# Patient Record
Sex: Female | Born: 1955
Health system: Southern US, Community
[De-identification: ages and names within clinical notes are randomized; demographics above are authoritative.]

## PROBLEM LIST (undated history)

## (undated) DIAGNOSIS — N200 Calculus of kidney: Secondary | ICD-10-CM

## (undated) DIAGNOSIS — I2510 Atherosclerotic heart disease of native coronary artery without angina pectoris: Secondary | ICD-10-CM

## (undated) DIAGNOSIS — I10 Essential (primary) hypertension: Secondary | ICD-10-CM

## (undated) HISTORY — PX: ABDOMINAL HYSTERECTOMY: SHX81

## (undated) HISTORY — DX: Calculus of kidney: N20.0

## (undated) HISTORY — PX: CORONARY ARTERY BYPASS GRAFT: SHX141

## (undated) HISTORY — PX: NEPHRECTOMY: SHX65

---

## 2010-03-07 ENCOUNTER — Encounter: Admission: RE | Admit: 2010-03-07 | Discharge: 2010-03-07 | Payer: Self-pay | Admitting: Infectious Diseases

## 2014-03-16 ENCOUNTER — Ambulatory Visit (INDEPENDENT_AMBULATORY_CARE_PROVIDER_SITE_OTHER): Payer: 59 | Admitting: Family Medicine

## 2014-03-16 VITALS — BP 122/80 | HR 90 | Temp 98.0°F | Resp 18 | Ht 59.0 in | Wt 111.0 lb

## 2014-03-16 DIAGNOSIS — J302 Other seasonal allergic rhinitis: Secondary | ICD-10-CM

## 2014-03-16 DIAGNOSIS — J329 Chronic sinusitis, unspecified: Secondary | ICD-10-CM

## 2014-03-16 DIAGNOSIS — J309 Allergic rhinitis, unspecified: Secondary | ICD-10-CM

## 2014-03-16 MED ORDER — FLUTICASONE PROPIONATE 50 MCG/ACT NA SUSP: 2.0000 | Freq: Every day | NASAL | Status: DC

## 2014-03-16 MED ORDER — AMOXICILLIN 500 MG PO CAPS: 500.0000 mg | ORAL_CAPSULE | Freq: Two times a day (BID) | ORAL | Status: DC

## 2014-03-16 NOTE — Patient Instructions (Signed)
Allergies Allergies may happen from anything your body is sensitive to. This may be food, medicines, pollens, chemicals, and nearly anything around you in everyday life that produces allergens. An allergen is anything that causes an allergy producing substance. Heredity is often a factor in causing these problems. This means you may have some of the same allergies as your parents. Food allergies happen in all age groups. Food allergies are some of the most severe and life threatening. Some common food allergies are cow's milk, seafood, eggs, nuts, wheat, and soybeans. SYMPTOMS   Swelling around the mouth.  An itchy red rash or hives.  Vomiting or diarrhea.  Difficulty breathing. SEVERE ALLERGIC REACTIONS ARE LIFE-THREATENING. This reaction is called anaphylaxis. It can cause the mouth and throat to swell and cause difficulty with breathing and swallowing. In severe reactions only a trace amount of food (for example, peanut oil in a salad) may cause death within seconds. Seasonal allergies occur in all age groups. These are seasonal because they usually occur during the same season every year. They may be a reaction to molds, grass pollens, or tree pollens. Other causes of problems are house dust mite allergens, pet dander, and mold spores. The symptoms often consist of nasal congestion, a runny itchy nose associated with sneezing, and tearing itchy eyes. There is often an associated itching of the mouth and ears. The problems happen when you come in contact with pollens and other allergens. Allergens are the particles in the air that the body reacts to with an allergic reaction. This causes you to release allergic antibodies. Through a chain of events, these eventually cause you to release histamine into the blood stream. Although it is meant to be protective to the body, it is this release that causes your discomfort. This is why you were given anti-histamines to feel better. If you are unable to  pinpoint the offending allergen, it may be determined by skin or blood testing. Allergies cannot be cured but can be controlled with medicine. Hay fever is a collection of all or some of the seasonal allergy problems. It may often be treated with simple over-the-counter medicine such as diphenhydramine. Take medicine as directed. Do not drink alcohol or drive while taking this medicine. Check with your caregiver or package insert for child dosages. If these medicines are not effective, there are many new medicines your caregiver can prescribe. Stronger medicine such as nasal spray, eye drops, and corticosteroids may be used if the first things you try do not work well. Other treatments such as immunotherapy or desensitizing injections can be used if all else fails. Follow up with your caregiver if problems continue. These seasonal allergies are usually not life threatening. They are generally more of a nuisance that can often be handled using medicine. HOME CARE INSTRUCTIONS   If unsure what causes a reaction, keep a diary of foods eaten and symptoms that follow. Avoid foods that cause reactions.  If hives or rash are present:  Take medicine as directed.  You may use an over-the-counter antihistamine (diphenhydramine) for hives and itching as needed.  Apply cold compresses (cloths) to the skin or take baths in cool water. Avoid hot baths or showers. Heat will make a rash and itching worse.  If you are severely allergic:  Following a treatment for a severe reaction, hospitalization is often required for closer follow-up.  Wear a medic-alert bracelet or necklace stating the allergy.  You and your family must learn how to give adrenaline or use   an anaphylaxis kit.  If you have had a severe reaction, always carry your anaphylaxis kit or EpiPen with you. Use this medicine as directed by your caregiver if a severe reaction is occurring. Failure to do so could have a fatal outcome. SEEK MEDICAL  CARE IF:  You suspect a food allergy. Symptoms generally happen within 30 minutes of eating a food.  Your symptoms have not gone away within 2 days or are getting worse.  You develop new symptoms.  You want to retest yourself or your child with a food or drink you think causes an allergic reaction. Never do this if an anaphylactic reaction to that food or drink has happened before. Only do this under the care of a caregiver. SEEK IMMEDIATE MEDICAL CARE IF:   You have difficulty breathing, are wheezing, or have a tight feeling in your chest or throat.  You have a swollen mouth, or you have hives, swelling, or itching all over your body.  You have had a severe reaction that has responded to your anaphylaxis kit or an EpiPen. These reactions may return when the medicine has worn off. These reactions should be considered life threatening. MAKE SURE YOU:   Understand these instructions.  Will watch your condition.  Will get help right away if you are not doing well or get worse. Document Released: 01/30/2003 Document Revised: 03/03/2013 Document Reviewed: 07/06/2008 [facility] Patient Information 2014 [place], [place]. Vim xoang (Sinusitis) Vim xoang l hi?n t??ng t?y ??, ?au nh?c v s?ng (vim) ? cc xoang c?nh m?i. Xoang c?nh m?i l cc ti kh trong x??ng m?t (bn d??i m?t, gi?a trn ho?c trn m?t). Trong cc xoang c?nh m?i kh?e m?nh, d?ch nh?y c th? thot ra ngoi v khng kh c th? l?u thng qua chng theo ???ng m?i. Tuy nhin, khi cc xoang c?nh m?i b? vim, d?ch nh?y v khng kh c th? b? m?c k?t. ?i?u ny c th? lm cho vi khu?n v vi trng khc pht tri?n v gy nhi?m trng. Vim xoang c th? pht tri?n m?t cch nhanh chng v ko di trong m?t th?i gian ng?n (c?p tnh) ho?c ti?p t?c trong th?i gian di (m?n tnh). Vim xoang ko di h?n 12 tu?n ???c coi l m?n tnh.  NGUYN NHN Nguyn nhn vim xoang bao g?m:  D? ?ng.  D? d?ng c?u trc, ch?ng h?n nh? d?ch chuy?n v? tr c?a  s?n phn cch l? m?i (l?ch vch ng?n), c th? lm gi?m lu?ng khng kh qua m?i c?ng nh? cc xoang v ?nh h??ng ??n kh? n?ng d?n l?u c?a xoang.  B?t th??ng v? ch?c n?ng, ch?ng h?n nh? khi cc s?i lng nh? (lng mao) ph? cc xoang v gip lo?i b? d?ch nh?y khng ho?t ??ng ?ng ho?c khng c. TRI?U CH?NG Cc tri?u ch?ng c?a vim xoang c?p tnh v m?n tnh ??u gi?ng nhau. Cc tri?u ch?ng chnh l ?au v p l?c xung quanh xoang b? ?nh h??ng. Cc tri?u ch?ng khc bao g?m:  ?au r?ng trn.  ?au tai.  ?au ??u.  H?i th? hi.  Suy gi?m thnh gic v v? gic.  Ho n?ng h?n khi n?m.  M?t m?i.  S?t.  R? d?ch ??c t? m?i, th??ng c mu xanh v c th? ch?a m?.  S?ng v ?m h?n ? cc xoang b? ?nh h??ng. CH?N ?ON Chuyn gia ch?m [place] s?c kh?e s? khm th?c th?Suzzette Righter qu trnh Loren Racer, chuyn gia ch?m [place] s?c kh?e c th?:  Soi m?i c?a b?n  xem c cc d?u hi?u c?a c?c u b?t th??ng trong l? m?i (polyp m?i) khng.  G vo cc xoang b? ?nh h??ng ?? ki?m tra d?u hi?u nhi?m trng.  Xem bn trong cc xoang (n?i soi) b?ng m?t thi?t b? hnh ?nh ??c bi?t c g?n ?n (n?i soi) ???c ??a vo xoang. N?u chuyn gia ch?m [place] s?c kh?e nghi ng? r?ng b?n b? vim xoang m?n tnh, m?t ho?c nhi?u xt nghi?m sau ?y c th? ???c khuy?n ngh?:  Xt nghi?m d? ?ng.  L?y m?u c?y ? m?i-M?t m?u d?ch nh?y ???c l?y t? m?i c?a b?n v g?i ??n phng th nghi?m ?? ki?m tra vi khu?n.  T? bo h?c ? m?i-M?t m?u d?ch nh?y ???c l?y t? m?i c?a b?n v xt nghi?m b?i chuyn gia ch?m [place] s?c kh?e ?? xc ??nh xem tnh tr?ng vim xoang c?a b?n c lin quan ??n d? ?ng hay khng. ?I?U TR? H?u h?t cc tr??ng h?p vim xoang c?p tnh c lin quan ??n nhi?m vi rt v s? t? kh?i trong vng 10 ngy. ?i khi thu?c ???c k ??n ?? gip lm gi?m cc tri?u ch?ng (thu?c gi?m ?au, thu?c ch?ng sung huy?t m?i, thu?c x?t m?i steroid ho?c bnh x?t n??c mu?i). Tuy nhin, v?i vim xoang lin quan ??n nhi?m vi khu?n, chuyn gia ch?m [place] s?c kh?e s? k thu?c khng sinh.  ?y l nh?ng lo?i thu?c s? gip tiu di?t vi khu?n gy nhi?m trng. Trong tr??ng h?p hi?m g?p, vim xoang do nhi?m n?m. Trong nh?ng tr??ng h?p ny, chuyn gia ch?m [place] s?c kh?e s? k thu?c khng n?m. M?t s? tr??ng h?p vim xoang m?n tnh s? c?n ph?u thu?t. Ni chung, ?y l nh?ng tr??ng h?p vim xoang ti pht trn 3 l?n m?i n?m, m?c d ? th?c hi?n cc ph??ng php ?i?u tr? khc. H??NG D?N CH?M [place] T?I NH  U?ng th?t nhi?u n??c. N??c gip lm long d?ch nh?y ?? xoang c th? d?n l?u ra d? dng h?n.  S? d?ng my t?o ?m.  Ht h?i n??c 3 ??n 4 l?n m?t ngy (v d?, ng?i trong phng t?m v?i vi sen ?ang ch?y).  ??t kh?n ?m, ?m ln m?t 3 ??n 4 l?n m?t ngy, ho?c theo ch? d?n c?a chuyn gia ch?m [place] s?c kh?e.  S? d?ng bnh x?t m?i ch?a n??c mu?i ?? gip lm ?m v lm s?ch xoang.  Ch? s? d?ng thu?c khng c?n k toa ho?c thu?c c?n k toa ?? gi?m ?au, gi?m c?m gic kh ch?u ho?c h? s?t theo ch? d?n c?a chuyn gia ch?m [place] s?c kh?e c?a b?n. HY NGAY L?P T?C ?I KHM N?U:  B?n b? ?au gia t?ng ho?c ?au ??u n?ng.  B?n b? bu?n nn, nn m?a ho?c bu?n ng?.  B?n b? s?ng xung quanh m?t.  B?n c v?n ?? v? th? l?c.  B?n b? c?ng c?.  B?n b? kh th?. ??M B?O B?N:  Hi?u cc h??ng d?n ny.  S? theo di tnh tr?ng c?a mnh.  S? yu c?u tr? gip ngay l?p t?c n?u b?n c?m th?y khng ?? ho?c tnh tr?ng tr?m tr?ng h?n. Document Released: 05/07/2012 Document Revised: 07/09/2013 [facility] Patient Information 2014 [place], [place].

## 2014-03-16 NOTE — Progress Notes (Signed)
 Chief Complaint:  Chief Complaint  Patient presents with  . Allergies  . Sore Throat  . Headache  . Fatigue    HPI: Ana Glenn is a 58 y.o. female who is here for:  Sinus issues and sore throat x 1 week. + HA, + sinus congestion. She has allergies, has been taking claritin . Denies cough. Nonsmoker, denies fevers, chills. Has sinus HA and congestion. Denies ear pain. She has not had any CP or SOB.    She has her heart procedure done 1971, for some growth on her heart She had hysterctomy benign fibroids  in 1987 Has been in [place] for 5 [place] Has a history of renal stones, last checked in [place] was  9 mm stone on left side.  She is currently asymptomatic for her kidney stone   Past Medical History  Diagnosis Date  . Kidney stones    Past Surgical History  Procedure Laterality Date  . Coronary artery bypass graft    . Abdominal hysterectomy     History   Social History  . Marital Status: Married    Spouse Name: N/A    Number of Children: N/A  . Years of Education: N/A   Social History Main Topics  . Smoking status: Never Smoker   . Smokeless tobacco: None  . Alcohol Use: No  . Drug Use: No  . Sexual Activity: None   Other Topics Concern  . None   Social History Narrative  . None   Family History  Problem Relation Age of Onset  . Diabetes Mother   . Cancer Brother    No Known Allergies Prior to Admission medications   Not on File     ROS: The patient denies fevers, chills, night sweats, unintentional weight loss, chest pain, palpitations, wheezing, dyspnea on exertion, nausea, vomiting, abdominal pain, dysuria, hematuria, melena, numbness, weakness, or tingling.   All other systems have been reviewed and were otherwise negative with the exception of those mentioned in the HPI and as above.    PHYSICAL EXAM: Filed Vitals:   03/16/14 1408  BP: 122/80  Pulse: 90  Temp: 98 F (36.7 C)  Resp: 18   Filed Vitals:   03/16/14 1408  Height: 4'  11" (1.499 m)  Weight: 111 lb (50.349 kg)   Body mass index is 22.41 kg/(m^2).  General: Alert, no acute distress HEENT:  Normocephalic, atraumatic, oropharynx patent. EOMI, PERRLA, TM nl, + sinus tenderness, no erythema or exudates of OP Cardiovascular:  Regular rate and rhythm, no rubs murmurs or gallops.  No Carotid bruits, radial pulse intact. No pedal edema.  Respiratory: Clear to auscultation bilaterally.  No wheezes, rales, or rhonchi.  No cyanosis, no use of accessory musculature GI: No organomegaly, abdomen is soft and non-tender, positive bowel sounds.  No masses. Skin: No rashes. Neurologic: Facial musculature symmetric. Psychiatric: Patient is appropriate throughout our interaction. Lymphatic: No cervical lymphadenopathy Musculoskeletal: Gait intact.   LABS: No results found for this or any previous visit.   EKG/XRAY:   Primary [facility]   ASSESSMENT/PLAN: Encounter Diagnoses  Name Primary?  . Seasonal allergies Yes  . Sinusitis    Cw Claritin Rx Flonase Rx Amox x 10 days if sxs treatment does not help OTc cough meds F/u prn  Gross sideeffects, risk and benefits, and alternatives of medications d/w patient. Patient is aware that all medications have potential sideeffects and we are unable to predict every sideeffect  or drug-drug interaction that may occur.   P , DO 03/17/2014 9:22 AM

## 2014-12-07 ENCOUNTER — Ambulatory Visit (INDEPENDENT_AMBULATORY_CARE_PROVIDER_SITE_OTHER): Payer: 59

## 2014-12-07 ENCOUNTER — Ambulatory Visit (INDEPENDENT_AMBULATORY_CARE_PROVIDER_SITE_OTHER): Payer: 59 | Admitting: Family Medicine

## 2014-12-07 VITALS — BP 148/90 | HR 58 | Temp 97.7°F | Resp 16 | Ht 60.0 in | Wt 106.6 lb

## 2014-12-07 DIAGNOSIS — Z951 Presence of aortocoronary bypass graft: Secondary | ICD-10-CM

## 2014-12-07 DIAGNOSIS — R42 Dizziness and giddiness: Secondary | ICD-10-CM

## 2014-12-07 DIAGNOSIS — Z87442 Personal history of urinary calculi: Secondary | ICD-10-CM

## 2014-12-07 DIAGNOSIS — A499 Bacterial infection, unspecified: Secondary | ICD-10-CM

## 2014-12-07 DIAGNOSIS — I951 Orthostatic hypotension: Secondary | ICD-10-CM

## 2014-12-07 DIAGNOSIS — N390 Urinary tract infection, site not specified: Secondary | ICD-10-CM

## 2014-12-07 DIAGNOSIS — R002 Palpitations: Secondary | ICD-10-CM

## 2014-12-07 LAB — POCT CBC
Granulocyte percent: 83.1 %G — AB (ref 37–80)
HCT, POC: 44.2 % (ref 37.7–47.9)
Hemoglobin: 13.8 g/dL (ref 12.2–16.2)
Lymph, poc: 1.3 (ref 0.6–3.4)
MCH, POC: 26.2 pg — AB (ref 27–31.2)
MCHC: 31.4 g/dL — AB (ref 31.8–35.4)
MCV: 83.7 fL (ref 80–97)
MID (cbc): 0.4 (ref 0–0.9)
MPV: 7.4 fL (ref 0–99.8)
POC Granulocyte: 8.3 — AB (ref 2–6.9)
POC LYMPH PERCENT: 13.3 %L (ref 10–50)
POC MID %: 3.6 % (ref 0–12)
Platelet Count, POC: 234 10*3/uL (ref 142–424)
RBC: 5.28 M/uL (ref 4.04–5.48)
RDW, POC: 15.8 %
WBC: 10 10*3/uL (ref 4.6–10.2)

## 2014-12-07 LAB — POCT UA - MICROSCOPIC ONLY
Bacteria, U Microscopic: NEGATIVE
Casts, Ur, LPF, POC: NEGATIVE
Crystals, Ur, HPF, POC: NEGATIVE
Epithelial cells, urine per micros: NEGATIVE
Mucus, UA: NEGATIVE
Yeast, UA: NEGATIVE

## 2014-12-07 LAB — COMPLETE METABOLIC PANEL WITHOUT GFR
ALT: 15 U/L (ref 0–35)
AST: 18 U/L (ref 0–37)
BUN: 10 mg/dL (ref 6–23)
Calcium: 9.4 mg/dL (ref 8.4–10.5)
Chloride: 102 meq/L (ref 96–112)
GFR, Est Non African American: >89 mL/min
Potassium: 4.1 meq/L (ref 3.5–5.3)
Sodium: 141 meq/L (ref 135–145)
Total Bilirubin: 0.5 mg/dL (ref 0.2–1.2)

## 2014-12-07 LAB — POCT URINALYSIS DIPSTICK
Bilirubin, UA: NEGATIVE
Glucose, UA: NEGATIVE
Ketones, UA: NEGATIVE
Nitrite, UA: NEGATIVE
Protein, UA: NEGATIVE
Spec Grav, UA: 1.01
Urobilinogen, UA: 0.2
pH, UA: 6

## 2014-12-07 LAB — LIPID PANEL
Cholesterol: 240 mg/dL — ABNORMAL HIGH (ref 0–200)
HDL: 76 mg/dL (ref >39)
LDL Cholesterol: 147 mg/dL — ABNORMAL HIGH (ref 0–99)
Total CHOL/HDL Ratio: 3.2 ratio
Triglycerides: 87 mg/dL (ref <150)
VLDL: 17 mg/dL (ref 0–40)

## 2014-12-07 LAB — COMPLETE METABOLIC PANEL WITH GFR
Albumin: 4.3 g/dL (ref 3.5–5.2)
Alkaline Phosphatase: 61 U/L (ref 39–117)
CO2: 27 mEq/L (ref 19–32)
Creat: 0.56 mg/dL (ref 0.50–1.10)
GFR, Est African American: >89 mL/min
Glucose, Bld: 83 mg/dL (ref 70–99)
Total Protein: 7.9 g/dL (ref 6.0–8.3)

## 2014-12-07 LAB — TSH: TSH: 0.695 u[IU]/mL (ref 0.350–4.500)

## 2014-12-07 MED ORDER — CEPHALEXIN 500 MG PO CAPS
500.0000 mg | ORAL_CAPSULE | Freq: Three times a day (TID) | ORAL | Status: DC
Start: 2014-12-07 — End: 2015-02-02

## 2014-12-07 NOTE — Progress Notes (Signed)
Chief Complaint:  Chief Complaint  Patient presents with  . Fatigue  . Dizziness    HPI: Ana Glenn is a 59 y.o. female who is here for chills since this morning, she has taken tylenol, sha has had subjective fevers.She has sore throat. She has no rashes. She has no diarrhea. She has had a poor appeitite.  She has back pain but has no abd pain. She has no probems urianting,  She has fatigue . She started drinking more water this morning, normally does not have a lot of fluids. She had a charley horse in her legft leg this AM.  Her husband was seen in ER for PNA. She has not tried anythign for this. She has a personal hx of renal stones, last one wa son left side and was about 9 mm per patient when seh was in [place], she has been here for 6 years already.   She has intermittent palpitations, at rest and with exertion. Deneis CP or SOB or wheezing at this time. NO pedal edema. She has not seen a cardiologist since she has been here. She has had CABG and from what I can tell this sounds like a ? Pericardiectomy , but I am not sure, this was was done in 1971, and she states that she ahd sever CP and SOB and they removed a flim ? Pericardium around her chest and she felt better. She states she never had a stent or anything done to her vessel. She is not followed by cardiology.   She has has uterus removed for fibroids.  Past Medical History  Diagnosis Date  . Kidney stones    Past Surgical History  Procedure Laterality Date  . Coronary artery bypass graft    . Abdominal hysterectomy     History   Social History  . Marital Status: Married    Spouse Name: N/A    Number of Children: N/A  . Years of Education: N/A   Social History Main Topics  . Smoking status: Never Smoker   . Smokeless tobacco: None  . Alcohol Use: No  . Drug Use: No  . Sexual Activity: None   Other Topics Concern  . None   Social History Narrative   Family History  Problem Relation Age of Onset  .  Diabetes Mother   . Cancer Brother    No Known Allergies Prior to Admission medications   Medication Sig Start Date End Date Taking? Authorizing Provider  amoxicillin (AMOXIL) 500 MG capsule Take 1 capsule (500 mg total) by mouth 2 (two) times daily. Patient not taking: Reported on 12/07/2014 03/16/14   Yannis Gumbs P Shone Leventhal, DO  fluticasone (FLONASE) 50 MCG/ACT nasal spray Place 2 sprays into both nostrils daily. Patient not taking: Reported on 12/07/2014 03/16/14   Kurk Corniel P Leahann Lempke, DO     ROS: The patient denies fevers, chills, night sweats, unintentional weight loss, chest pain,  wheezing, dyspnea on exertion, nausea, vomiting, abdominal pain, dysuria, hematuria, melena, numbness,  or tingling.   All other systems have been reviewed and were otherwise negative with the exception of those mentioned in the HPI and as above.    PHYSICAL EXAM: Filed Vitals:   12/07/14 1432  BP: 148/90  Pulse: 58  Temp:   Resp:    Filed Vitals:   12/07/14 1238  Height: 5' (1.524 m)  Weight: 106 lb 9.6 oz (48.353 kg)   Body mass index is 20.82 kg/(m^2).  General: Alert, no acute  distress HEENT:  Normocephalic, atraumatic, oropharynx patent. EOMI, PERRLA. + Left TM slight erythematous na dull but she states she has that normally Cardiovascular:  Regular rate and rhythm, no rubs murmurs or gallops.  No Carotid bruits, radial pulse intact. No pedal edema.  + CABG scar Respiratory: Clear to auscultation bilaterally.  No wheezes, rales, or rhonchi.  No cyanosis, no use of accessory musculature GI: No organomegaly, abdomen is soft and non-tender, positive bowel sounds.  No masses. Skin: No rashes. Neurologic: Facial musculature symmetric. CN 2-12 grossly normal Psychiatric: Patient is appropriate throughout our interaction. Lymphatic: No cervical lymphadenopathy Musculoskeletal: Gait intact. 5/5 strength + right and left CVA tenderness   LABS: Results for orders placed or performed in visit on 12/07/14  Urine culture   Result Value Ref Range   Colony Count NO GROWTH    Organism ID, Bacteria NO GROWTH   TSH  Result Value Ref Range   TSH 0.695 0.350 - 4.500 uIU/mL  COMPLETE METABOLIC PANEL WITH GFR  Result Value Ref Range   Sodium 141 135 - 145 mEq/L   Potassium 4.1 3.5 - 5.3 mEq/L   Chloride 102 96 - 112 mEq/L   CO2 27 19 - 32 mEq/L   Glucose, Bld 83 70 - 99 mg/dL   BUN 10 6 - 23 mg/dL   Creat 5.78 4.69 - 6.29 mg/dL   Total Bilirubin 0.5 0.2 - 1.2 mg/dL   Alkaline Phosphatase 61 39 - 117 U/L   AST 18 0 - 37 U/L   ALT 15 0 - 35 U/L   Total Protein 7.9 6.0 - 8.3 g/dL   Albumin 4.3 3.5 - 5.2 g/dL   Calcium 9.4 8.4 - 52.8 mg/dL   GFR, Est African American >89 mL/min   GFR, Est Non African American >89 mL/min  Lipid panel  Result Value Ref Range   Cholesterol 240 (H) 0 - 200 mg/dL   Triglycerides 87 <413 mg/dL   HDL 76 >24 mg/dL   Total CHOL/HDL Ratio 3.2 Ratio   VLDL 17 0 - 40 mg/dL   LDL Cholesterol 401 (H) 0 - 99 mg/dL  POCT urinalysis dipstick  Result Value Ref Range   Color, UA yellow    Clarity, UA clear    Glucose, UA neg    Bilirubin, UA neg    Ketones, UA neg    Spec Grav, UA 1.010    Blood, UA moderate    pH, UA 6.0    Protein, UA neg    Urobilinogen, UA 0.2    Nitrite, UA neg    Leukocytes, UA Trace   POCT UA - Microscopic Only  Result Value Ref Range   WBC, Ur, HPF, POC 1-4    RBC, urine, microscopic 0-2    Bacteria, U Microscopic neg    Mucus, UA neg    Epithelial cells, urine per micros neg    Crystals, Ur, HPF, POC neg    Casts, Ur, LPF, POC neg    Yeast, UA neg   POCT CBC  Result Value Ref Range   WBC 10.0 4.6 - 10.2 K/uL   Lymph, poc 1.3 0.6 - 3.4   POC LYMPH PERCENT 13.3 10 - 50 %L   MID (cbc) 0.4 0 - 0.9   POC MID % 3.6 0 - 12 %M   POC Granulocyte 8.3 (A) 2 - 6.9   Granulocyte percent 83.1 (A) 37 - 80 %G   RBC 5.28 4.04 - 5.48 M/uL   Hemoglobin 13.8 12.2 -  16.2 g/dL   HCT, POC 16.1 09.6 - 47.9 %   MCV 83.7 80 - 97 fL   MCH, POC 26.2 (A) 27 - 31.2  pg   MCHC 31.4 (A) 31.8 - 35.4 g/dL   RDW, POC 04.5 %   Platelet Count, POC 234 142 - 424 K/uL   MPV 7.4 0 - 99.8 fL     EKG/XRAY:   Primary read interpreted by Dr. Conley [facility]s at [facility]. Looks similar to 2011, no acute cardiopulmoary process, aortic notch is prominent   ASSESSMENT/PLAN: Encounter Diagnoses  Name Primary?  . Dizziness and giddiness   . Heart palpitations   . UTI (urinary tract infection), bacterial Yes  . Hx of CABG   . Orthostatic hypotension   . History of kidney stones    60 y/o [place] female here with sxs of fatigue, dizziness and heart palpitations, she ah sbeen in the hospital with her husband who was dx owth PNA nd ahs not been eating or drinking much.  She is orthostatic, advise to Get up and down slowly, orthostatic due to ? dehydration Labs pending Keflex TID x 7 days for possible UTI, she has hematuria and also some trace leuks and a shift in her  Granulocyte count on CBC without leukocytosis,  urine cx pending Push fluids Refer to cardiology for hx of CABG and orthostatics  Await for official xray , F/u in 3 days if no improvement or prn   Gross sideeffects, risk and benefits, and alternatives of medications d/w patient. Patient is aware that all medications have potential sideeffects and we are unable to predict every sideeffect or drug-drug interaction that may occur.  Deondra Wigger PHUONG, DO 12/09/2014 1:58 PM

## 2014-12-07 NOTE — Patient Instructions (Signed)

## 2014-12-08 LAB — URINE CULTURE
Colony Count: NO GROWTH
Organism ID, Bacteria: NO GROWTH

## 2014-12-09 ENCOUNTER — Telehealth: Payer: Self-pay | Admitting: Family Medicine

## 2014-12-09 ENCOUNTER — Telehealth: Payer: Self-pay

## 2014-12-09 ENCOUNTER — Telehealth: Payer: Self-pay | Admitting: *Deleted

## 2014-12-09 ENCOUNTER — Telehealth: Payer: Self-pay | Admitting: Radiology

## 2014-12-09 DIAGNOSIS — Z87442 Personal history of urinary calculi: Secondary | ICD-10-CM | POA: Insufficient documentation

## 2014-12-09 DIAGNOSIS — N200 Calculus of kidney: Secondary | ICD-10-CM

## 2014-12-09 MED ORDER — TAMSULOSIN HCL 0.4 MG PO CAPS: 0.4000 mg | ORAL_CAPSULE | Freq: Every evening | ORAL | Status: DC | PRN

## 2014-12-09 NOTE — Telephone Encounter (Signed)
Pt calling about lab work.

## 2014-12-09 NOTE — Telephone Encounter (Signed)
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

## 2014-12-09 NOTE — Telephone Encounter (Signed)
Gave results on notes but I told them that you would give them a call also.    Phone [phone] you can speak with [place]

## 2014-12-09 NOTE — Telephone Encounter (Signed)
Advise to call me back, wanted to d/w patient labs. PLease ask them tog ive me a good number to reach them, and also th ename of the persont ot alk to who can speak english well  TSH, CMP normal Cholesterol slightly high , sicne she ahd som strange cardiac surgery I would recommend statin but not sure if she will take it.  Urine cx was normal and did not grow out anything. She can continue with the keflex for a total of 3 days and then stop.  Her chest xray showed a right lung nodule that the radiologist saw in 2011 and felt it was a pruior infection, the size of the right upper lung nodule has been unchanged. She needs to keep an eye on this. Also I would like to know if she has had a TB test in the recent past.  The radiologist thinks he saw a bunch of kidney stones on the left side. I would like her to take flomax and also drink a lot of fluids. Will refer her to urology since the stones are large in size.

## 2015-01-07 ENCOUNTER — Institutional Professional Consult (permissible substitution): Payer: Self-pay | Admitting: Cardiovascular Disease

## 2015-02-02 ENCOUNTER — Encounter: Payer: Self-pay | Admitting: Cardiovascular Disease

## 2015-02-02 ENCOUNTER — Ambulatory Visit (INDEPENDENT_AMBULATORY_CARE_PROVIDER_SITE_OTHER): Payer: 59 | Admitting: Cardiovascular Disease

## 2015-02-02 VITALS — BP 142/90 | HR 90 | Ht 60.0 in | Wt 108.8 lb

## 2015-02-02 DIAGNOSIS — Z8679 Personal history of other diseases of the circulatory system: Secondary | ICD-10-CM | POA: Insufficient documentation

## 2015-02-02 DIAGNOSIS — Z9889 Other specified postprocedural states: Secondary | ICD-10-CM

## 2015-02-02 DIAGNOSIS — E785 Hyperlipidemia, unspecified: Secondary | ICD-10-CM

## 2015-02-02 DIAGNOSIS — Z951 Presence of aortocoronary bypass graft: Secondary | ICD-10-CM

## 2015-02-02 MED ORDER — ATORVASTATIN CALCIUM 40 MG PO TABS: 40.0000 mg | ORAL_TABLET | Freq: Every day | ORAL | Status: DC

## 2015-02-02 NOTE — Patient Instructions (Signed)
Your physician has recommended you make the following change in your medication:  START Atorvastatin 40 mg once daily  Your physician recommends that you return for lab work in: 3 months for FASTING lab work - cholesterol, basic metabolic panel, liver panel  Your physician has requested that you have an echocardiogram. Echocardiography is a painless test that uses sound waves to create images of your heart. It provides your doctor with information about the size and shape of your heart and how well your heart's chambers and valves are working. This procedure takes approximately one hour. There are no restrictions for this procedure.  Your physician wants you to follow-up in: 6 months with Dr. Elease HashimotoNahser.  You will receive a reminder letter in the mail two months in advance. If you don't receive a letter, please call our office to schedule the follow-up appointment.

## 2015-02-02 NOTE — Progress Notes (Signed)
Cardiology Office Note   Date:  02/02/2015   ID:  Ana Glenn, DOB 20-Oct-1956, MRN [id number]  PCP:  Pcp Not In System  Cardiologist:   Ronnesha Mester, Deloris Ping, MD   No chief complaint on file.  Problem List  1. S/p pericardectomy - in [place] , in her 30s  2.    February 03, 2015: Ana Glenn is a 59 y.o. female who presents for follow up of occasional episodes of orthostasis. She is seen with her son in law  Darrold Span) who acts as Hydrologist.   She has rare episodes of very brief CP .  Has rare episodes of palpitations   Non smoker Walks occasionally , strolls her grandchild. Denies any chest pain or dyspnea with walking .      Past Medical History  Diagnosis Date  . Kidney stones     Past Surgical History  Procedure Laterality Date  . Coronary artery bypass graft    . Abdominal hysterectomy       Current Outpatient Prescriptions  Medication Sig Dispense Refill  . fluticasone (FLONASE) 50 MCG/ACT nasal spray Place 2 sprays into both nostrils daily. 16 g 5  . Potassium Citrate 15 MEQ (1620 MG) TBCR Take 15 mEq by mouth daily.    . tamsulosin (FLOMAX) 0.4 MG CAPS capsule Take 1 capsule (0.4 mg total) by mouth at bedtime as needed. becareful of dizziness, must drink water. 15 capsule 0   No current facility-administered medications for this visit.    Allergies:   Review of patient's allergies indicates no known allergies.    Social History:  The patient  reports that she has never smoked. She does not have any smokeless tobacco history on file. She reports that she does not drink alcohol or use illicit drugs.   Family History:  The patient's family history includes Cancer in her brother; Diabetes in her mother.    ROS:  Please see the history of present illness.    Review of Systems: Constitutional:  denies fever, chills, diaphoresis, appetite change and fatigue.  HEENT: denies photophobia, eye pain, redness, hearing loss, ear pain, congestion, sore throat,  rhinorrhea, sneezing, neck pain, neck stiffness and tinnitus.  Respiratory: denies SOB, DOE, cough, chest tightness, and wheezing.  Cardiovascular: denies chest pain, palpitations and leg swelling.  Gastrointestinal: denies nausea, vomiting, abdominal pain, diarrhea, constipation, blood in stool.  Genitourinary: denies dysuria, urgency, frequency, hematuria, flank pain and difficulty urinating.  Musculoskeletal: denies  myalgias, back pain, joint swelling, arthralgias and gait problem.   Skin: denies pallor, rash and wound.  Neurological: denies dizziness, seizures, syncope, weakness, light-headedness, numbness and headaches.   Hematological: denies adenopathy, easy bruising, personal or family bleeding history.  Psychiatric/ Behavioral: denies suicidal ideation, mood changes, confusion, nervousness, sleep disturbance and agitation.       All other systems are reviewed and negative.    PHYSICAL EXAM: VS:  BP 142/90 mmHg  Pulse 90  Ht 5' (1.524 m)  Wt 108 lb 12.8 oz (49.351 kg)  BMI 21.25 kg/m2 , BMI Body mass index is 21.25 kg/(m^2). GEN: Well nourished, well developed, in no acute distress HEENT: normal Neck: no JVD, carotid bruits, or masses Cardiac: RRR; no murmurs, rubs, or gallops,no edema ,  Sternotomy scar Respiratory:  clear to auscultation bilaterally, normal work of breathing GI: soft, nontender, nondistended, + BS MS: no deformity or atrophy Skin: warm and dry, no rash Neuro:  Strength and sensation are intact Psych: normal   EKG:  EKG is ordered today. The ekg ordered today demonstrates NSR at 90.  No ST or T wave changes   Recent Labs: 12/07/2014: ALT 15; BUN 10; Creatinine 0.56; Hemoglobin 13.8; Potassium 4.1; Sodium 141; TSH 0.695    Lipid Panel    Component Value Date/Time   CHOL 240* 12/07/2014 1545   TRIG 87 12/07/2014 1545   HDL 76 12/07/2014 1545   CHOLHDL 3.2 12/07/2014 1545   VLDL 17 12/07/2014 1545   LDLCALC 147* 12/07/2014 1545      Wt  Readings from Last 3 Encounters:  02/02/15 108 lb 12.8 oz (49.351 kg)  12/07/14 106 lb 9.6 oz (48.353 kg)  03/16/14 111 lb (50.349 kg)      Other studies Reviewed: Additional studies/ records that were reviewed today include: . Review of the above records demonstrates:    ASSESSMENT AND PLAN:  1.  History of pericarditis: The patient has a history of pericardiectomy. She is status post pericardotomy in the 711970s while in [place]. We will check an echocardiogram for follow-up of her history of pericarditis and pericardectomy.  She's not having any signs or symptoms of congestive heart failure and no signs or symptoms of constrictive pericarditis. I'll see her in 6 month. I will anticipate seeing her yearly follow-up.  2. Hyperlipidemia: Her lipids were checked by her primary doctor. Her LDL is in the 140s. We'll start her on atorvastatin 40 mg a day. We'll check fasting labs in 3 months.   Current medicines are reviewed at length with the patient today.  The patient does not have concerns regarding medicines.  The following changes have been made:  no change  Labs/ tests ordered today include:  No orders of the defined types were placed in this encounter.     Disposition:   FU with me in 6 months.     Signed, Taysean Wager, Deloris PingPhilip J, MD  02/02/2015 10:02 AM    [facility] [address], [place], [place]  [address] Phone: [phone]; Fax: [phone]

## 2015-02-11 ENCOUNTER — Ambulatory Visit (HOSPITAL_COMMUNITY): Payer: 59 | Attending: Cardiology

## 2015-02-11 DIAGNOSIS — E785 Hyperlipidemia, unspecified: Secondary | ICD-10-CM | POA: Insufficient documentation

## 2015-02-11 DIAGNOSIS — Z9889 Other specified postprocedural states: Secondary | ICD-10-CM | POA: Diagnosis not present

## 2015-02-11 NOTE — Progress Notes (Signed)
2D Echo completed. 02/11/2015 

## 2015-02-17 ENCOUNTER — Telehealth: Payer: Self-pay | Admitting: Cardiovascular Disease

## 2015-02-17 NOTE — Telephone Encounter (Signed)
Pt's son call re Echo results , pls call [phone]

## 2015-02-17 NOTE — Telephone Encounter (Signed)
**Note De-Identified Ethleen Lormand Obfuscation** The pts son is advised of the pts Echo results and he verbalized understanding.

## 2015-05-05 ENCOUNTER — Other Ambulatory Visit: Payer: 59

## 2015-10-01 ENCOUNTER — Encounter: Payer: Self-pay | Admitting: Family Medicine

## 2015-10-01 ENCOUNTER — Ambulatory Visit (INDEPENDENT_AMBULATORY_CARE_PROVIDER_SITE_OTHER): Payer: No Typology Code available for payment source | Admitting: Family Medicine

## 2015-10-01 VITALS — BP 149/86 | HR 90 | Temp 98.4°F | Resp 14 | Ht 59.0 in | Wt 106.0 lb

## 2015-10-01 DIAGNOSIS — M25511 Pain in right shoulder: Secondary | ICD-10-CM

## 2015-10-01 DIAGNOSIS — E785 Hyperlipidemia, unspecified: Secondary | ICD-10-CM

## 2015-10-01 DIAGNOSIS — H539 Unspecified visual disturbance: Secondary | ICD-10-CM | POA: Insufficient documentation

## 2015-10-01 DIAGNOSIS — R030 Elevated blood-pressure reading, without diagnosis of hypertension: Secondary | ICD-10-CM

## 2015-10-01 DIAGNOSIS — Z0000 Encounter for general adult medical examination without abnormal findings: Secondary | ICD-10-CM

## 2015-10-01 DIAGNOSIS — IMO0001 Reserved for inherently not codable concepts without codable children: Secondary | ICD-10-CM

## 2015-10-01 DIAGNOSIS — Z1239 Encounter for other screening for malignant neoplasm of breast: Secondary | ICD-10-CM

## 2015-10-01 DIAGNOSIS — M25562 Pain in left knee: Secondary | ICD-10-CM

## 2015-10-01 DIAGNOSIS — M25561 Pain in right knee: Secondary | ICD-10-CM

## 2015-10-01 DIAGNOSIS — Z789 Other specified health status: Secondary | ICD-10-CM

## 2015-10-01 DIAGNOSIS — Z8679 Personal history of other diseases of the circulatory system: Secondary | ICD-10-CM

## 2015-10-01 DIAGNOSIS — Z23 Encounter for immunization: Secondary | ICD-10-CM

## 2015-10-01 LAB — COMPLETE METABOLIC PANEL WITH GFR
ALK PHOS: 57 U/L (ref 33–130)
ALT: 18 U/L (ref 6–29)
AST: 21 U/L (ref 10–35)
Albumin: 4.3 g/dL (ref 3.6–5.1)
BILIRUBIN TOTAL: 0.6 mg/dL (ref 0.2–1.2)
BUN: 12 mg/dL (ref 7–25)
CO2: 28 mmol/L (ref 20–31)
Calcium: 9.2 mg/dL (ref 8.6–10.4)
Chloride: 102 mmol/L (ref 98–110)
Creat: 0.56 mg/dL (ref 0.50–0.99)
GFR, Est African American: >89 mL/min (ref >=60)
GLUCOSE: 91 mg/dL (ref 65–99)
POTASSIUM: 4.3 mmol/L (ref 3.5–5.3)
SODIUM: 141 mmol/L (ref 135–146)
TOTAL PROTEIN: 7.6 g/dL (ref 6.1–8.1)

## 2015-10-01 LAB — CBC WITH DIFFERENTIAL/PLATELET
BASOS ABS: 0.1 10*3/uL (ref 0.0–0.1)
BASOS PCT: 1 % (ref 0–1)
EOS ABS: 0.1 10*3/uL (ref 0.0–0.7)
EOS PCT: 2 % (ref 0–5)
HCT: 41.3 % (ref 36.0–46.0)
Hemoglobin: 13.5 g/dL (ref 12.0–15.0)
Lymphocytes Relative: 37 % (ref 12–46)
Lymphs Abs: 2.3 10*3/uL (ref 0.7–4.0)
MCH: 26.5 pg (ref 26.0–34.0)
MCHC: 32.7 g/dL (ref 30.0–36.0)
MCV: 81.1 fL (ref 78.0–100.0)
MPV: 10.5 fL (ref 8.6–12.4)
Monocytes Absolute: 0.3 10*3/uL (ref 0.1–1.0)
Monocytes Relative: 5 % (ref 3–12)
NEUTROS PCT: 55 % (ref 43–77)
Neutro Abs: 3.5 10*3/uL (ref 1.7–7.7)
PLATELETS: 277 10*3/uL (ref 150–400)
RBC: 5.09 MIL/uL (ref 3.87–5.11)
RDW: 14.4 % (ref 11.5–15.5)
WBC: 6.3 10*3/uL (ref 4.0–10.5)

## 2015-10-01 LAB — POCT URINALYSIS DIP (DEVICE)
BILIRUBIN URINE: NEGATIVE
GLUCOSE, UA: NEGATIVE mg/dL
Ketones, ur: NEGATIVE mg/dL
Leukocytes, UA: NEGATIVE
NITRITE: NEGATIVE
PH: 7.5 (ref 5.0–8.0)
PROTEIN: NEGATIVE mg/dL
Specific Gravity, Urine: 1.015 (ref 1.005–1.030)
Urobilinogen, UA: 0.2 mg/dL (ref 0.0–1.0)

## 2015-10-01 LAB — LIPID PANEL
Cholesterol: 231 mg/dL — ABNORMAL HIGH (ref 125–200)
HDL: 75 mg/dL (ref >=46)
LDL CALC: 143 mg/dL — AB (ref <130)
Total CHOL/HDL Ratio: 3.1 Ratio (ref <=5.0)
Triglycerides: 65 mg/dL (ref <150)
VLDL: 13 mg/dL (ref <30)

## 2015-10-01 LAB — HEMOGLOBIN A1C
HEMOGLOBIN A1C: 6 % — AB (ref <5.7)
Mean Plasma Glucose: 126 mg/dL — ABNORMAL HIGH (ref <117)

## 2015-10-01 NOTE — Patient Instructions (Addendum)
Continue Tylenol 500 mg every 6 hours as needed for right shoulder pain.  Increase water intake to 4-5 glasses per day Will send a referral to opthalmology Will send a referral for a screening mammogram Reviewed EKG and Echocardiogram, we will repeat EKG on follow up exam in 2 months.  Fat and Cholesterol Restricted Diet Getting too much fat and cholesterol in your diet may cause health problems. Following this diet helps keep your fat and cholesterol at normal levels. This can keep you from getting sick. WHAT TYPES OF FAT SHOULD I CHOOSE?  Choose monosaturated and polyunsaturated fats. These are found in foods such as olive oil, canola oil, flaxseeds, walnuts, almonds, and seeds.  Eat more omega-3 fats. Good choices include salmon, mackerel, sardines, tuna, flaxseed oil, and ground flaxseeds.  Limit saturated fats. These are in animal products such as meats, butter, and cream. They can also be in plant products such as palm oil, palm kernel oil, and coconut oil.   Avoid foods with partially hydrogenated oils in them. These contain trans fats. Examples of foods that have trans fats are stick margarine, some tub margarines, cookies, crackers, and other baked goods. WHAT GENERAL GUIDELINES DO I NEED TO FOLLOW?   Check food labels. Look for the words "trans fat" and "saturated fat."  When preparing a meal:  Fill half of your plate with vegetables and green salads.  Fill one fourth of your plate with whole grains. Look for the word "whole" as the first word in the ingredient list.  Fill one fourth of your plate with lean protein foods.  Limit fruit to two servings a day. Choose fruit instead of juice.  Eat more foods with soluble fiber. Examples of foods with this type of fiber are apples, broccoli, carrots, beans, peas, and barley. Try to get 20-30 g (grams) of fiber per day.  Eat more home-cooked foods. Eat less at restaurants and buffets.  Limit or avoid alcohol.  Limit foods  high in starch and sugar.  Limit fried foods.  Cook foods without frying them. Baking, boiling, grilling, and broiling are all great options.  Lose weight if you are overweight. Losing even a small amount of weight can help your overall health. It can also help prevent diseases such as diabetes and heart disease. WHAT FOODS CAN I EAT? Grains Whole grains, such as whole wheat or whole grain breads, crackers, cereals, and pasta. Unsweetened oatmeal, bulgur, barley, quinoa, or brown rice. Corn or whole wheat flour tortillas. Vegetables Fresh or frozen vegetables (raw, steamed, roasted, or grilled). Green salads. Fruits All fresh, canned (in natural juice), or frozen fruits. Meat and Other Protein Products Ground beef (85% or leaner), grass-fed beef, or beef trimmed of fat. Skinless chicken or [place]. Ground chicken or [place]. Pork trimmed of fat. All fish and seafood. Eggs. Dried beans, peas, or lentils. Unsalted nuts or seeds. Unsalted canned or dry beans. Dairy Low-fat dairy products, such as skim or 1% milk, 2% or reduced-fat cheeses, low-fat ricotta or cottage cheese, or plain low-fat yogurt. Fats and Oils Tub margarines without trans fats. Light or reduced-fat mayonnaise and salad dressings. Avocado. Olive, canola, sesame, or safflower oils. Natural peanut or almond butter (choose ones without added sugar and oil). The items listed above may not be a complete list of recommended foods or beverages. Contact your dietitian for more options. WHAT FOODS ARE NOT RECOMMENDED? Grains White bread. White pasta. White rice. Cornbread. Bagels, pastries, and croissants. Crackers that contain trans fat. Vegetables White potatoes.  Corn. Creamed or fried vegetables. Vegetables in a cheese sauce. Fruits Dried fruits. Canned fruit in light or heavy syrup. Fruit juice. Meat and Other Protein Products Fatty cuts of meat. Ribs, chicken wings, bacon, sausage, bologna, salami, chitterlings, fatback, hot  dogs, bratwurst, and packaged luncheon meats. Liver and organ meats. Dairy Whole or 2% milk, cream, half-and-half, and cream cheese. Whole milk cheeses. Whole-fat or sweetened yogurt. Full-fat cheeses. Nondairy creamers and whipped toppings. Processed cheese, cheese spreads, or cheese curds. Sweets and Desserts Corn syrup, sugars, honey, and molasses. Candy. Jam and jelly. Syrup. Sweetened cereals. Cookies, pies, cakes, donuts, muffins, and ice cream. Fats and Oils Butter, stick margarine, lard, shortening, ghee, or bacon fat. Coconut, palm kernel, or palm oils. Beverages Alcohol. Sweetened drinks (such as sodas, lemonade, and fruit drinks or punches). The items listed above may not be a complete list of foods and beverages to avoid. Contact your dietitian for more information.   This information is not intended to replace advice given to you by your health care provider. Make sure you discuss any questions you have with your health care provider.   Document Released: 05/07/2012 Document Revised: 11/27/2014 Document Reviewed: 02/05/2014 Elsevier Interactive Patient Education 2016 Elsevier Inc. S?i th?n ([place]idney Stones) S?i th?n (s?i ti?t ni?u) l nh?ng ph?n l?ng ??ng hnh thnh bn trong th?n. C?n ?au d? d?i l do s?i di chuy?n qua ???ng ti?u gy ra. Khi s?i di chuy?n, ni?u qu?n co th?t quanh s?i. S?i th??ng thot ra trong n??c ti?u.  NGUYN NHN   M?t r?i lo?n lm cho m?t s? tuy?n ? c? s?n xu?t qu nhi?u hocmon tuy?n c?n gip (c??ng tuy?n c?n gip chnh).  qu trnh tch c? cc tinh th? axit uric, t??ng t? nh? b?nh gt ? cc kh?p.  Thu h?p (cht h?p) ni?u qu?n.  T?c ngh?n ? th?n xu?t hi?n khi sinh (t?c ngh?n b?m sinh).  Ph?u thu?t tr??c ?y ? th?n ho?c ni?u qu?n.  Nhi?m trng th?n n?ng. TRI?U CH?NG   C?m th?y kh ch?u trong d? dy (bu?n nn).  i (nn m?a).  ?i ti?u ra mu (ti?u ti?n ra mu).  ?au th??ng lan (t?a ra) xu?ng hng.  Th??ng xuyn ph?i ?i ti?u ho?c ?i ti?u  g?p. CH?N ?ON   [place]hai thc ti?n s? v khm th?c th?.  Xt nghi?m mu ho?c n??c ti?u.  Ch?p CT.  ?i khi, ki?m tra bn trong bng quang (soi bng quang) ???c th?c hi?n. ?I?U TR?   Theo di.  U?ng nhi?u n??c h?n.  Tn s?i b?ng sng ngoi c? th?-?y l m?t th? thu?t khng xm l?n s? d?ng sng xung ?? lm v? cc vin s?i th?n.  C th? c?n ph?u thu?t n?u qu v? b? ?au n?ng ho?c t?c ngh?n lin t?c. C nhi?u lo?i th? thu?t ph?u thu?t khc nhau. H?u h?t cc th? thu?t ???c th?c hi?n b?ng cch s? d?ng nh?ng d?ng c? nh?. Ch? c?n nh?ng v?t r?ch nh? ?? ??a nh?ng d?ng c? ny vo, v v?y, th?i gian h?i ph?c s? gi?m thi?u. Kch th??c, v? tr v thnh ph?n ha h?c ??u l nh?ng bi?n s? quan tr?ng s? quy?t ??nh l?a ch?n ph?n x? tr ph h?p cho b?n. Hy ni chuy?n v?i chuyn gia ch?m [place] s?c kh?e c?a qu v? ?? hi?u r h?n tnh tr?ng c?a qu v?, do ?, qu v? s? gi?m thi?u ???c nguy c? gy t?n th??ng cho b?n thn v th?n c?a qu v?.  H??NG D?N CH?M [place] T?I  NH   U?ng ?? n??c v ch?t l?ng ?? n??c ti?u trong ho?c c mu vng nh?t. ?i?u ny s? gip qu v? th?i b? s?i ho?c cc m?nh s?i v?.  L?c ton b? n??c ti?u qua m?t b? l?c ? c?p. Gi? l?i t?t c? cc ch?t d?ng h?t v s?i ?? chuyn gia ch?m [place] s?c kh?e xem. S?i gy ?au c th? nh? nh? h?t mu?i. ?i?u r?t quan tr?ng l ph?i s? d?ng b? l?c m?i l?n ?i ti?u. Thu th?p s?i s? cho php chuyn gia ch?m [place] s?c kh?e c?a qu v? phn tch s?i v xc minh xem s?i c th?c s? ???c th?i ra hay khng. Vi?c phn tch s?i th??ng s? xc ??nh vi?c qu v? c th? lm g ?? gi?m ti pht.  Ch? s? d?ng thu?c khng c?n k ??n ho?c thu?c c?n k ??n ?? gi?m ?au, gi?m c?m gic kh ch?u ho?c h? s?t theo ch? d?n c?a chuyn gia ch?m [place] s?c kh?e c?a b?n.  Tun th? t?t c? cc cu?c h?n khm l?i theo ch? d?n c?a chuyn gia ch?m [place] s?c kh?e. ?i?u ny c vai tr quan tr?ng.  Ti?n hnh ch?p X quang theo di n?u c?n thi?t. Khng b? ?au khng lun c ngh?a l s?i ? thot ra ngoi. S?i c th? ch?  ng?ng di chuy?n. N?u n??c ti?u v?n b? t?c hon ton, s?i c th? gy ra m?t ch?c n?ng th?n ho?c th?m ch l ph h?y th?n hon ton. Trch nhi?m c?a qu v? l b?o ??m hon t?t vi?c ch?p X quang v cc l?n khm l?i, Siu m th?n c th? cho th?y cc ch? t?c ngh?n v tnh tr?ng c?a th?n. Siu m khng lin quan ??n b?t k? b?c x? no v c th? ???c th?c hi?n d? dng trong vi pht.  Thay ??i ch? ?? ?n hng ngy theo ch? d?n c?a chuyn gia ch?m [place] s?c kh?e. Qu v? c th? ???c yu c?u:  H?n ch? l??ng mu?i m qu v? ?n.  ?n t? 5 kh?u ph?n tri cy v rau tr? ln m?i ngy.  H?n ch? l??ng th?t, th?t gia c?m, c v tr?ng m qu v? ?n.  L?y m?t m?u n??c ti?u 24 gi? theo ch? d?n c?a chuyn gia ch?m [place] s?c kh?e. Qu v? c th? c?n l?y m?t m?u n??c ti?u khc m?i 6-12 thng m?t l?n. ?I KHM N?U:  Qu v? b? ?au t?ng d?n v khng ?p ?ng v?i b?t k? thu?c gi?m ?au no ? ???c k toa cho qu v?. NGAY L?P T?C ?I KHM N?U:   ?au khng th? ki?m sot ???c b?ng thu?c ? k ??n.  Qu v? b? s?t ho?c rng mnh ?n l?nh.  M?c ?? n?ng ho?c c??ng ?? ?au t?ng ln trong 18 gi? v khng thuyn gi?m sau khi s? d?ng thu?c gi?m ?au.  Qu v? b? c?n ?au b?ng m?i.  Qu v? c?m th?y mu?n ng?t ho?c ng?t.  Qu v? khng th? ?i ti?u.   Thng tin ny khng nh?m m?c ?ch thay th? cho l?i khuyn m chuyn gia ch?m [place] s?c kh?e ni v?i qu v?. Hy b?o ??m qu v? ph?i th?o lu?n b?t k? v?n ?? g m qu v? c v?i chuyn gia ch?m  s?c kh?e c?a qu v?.   Document Released: 11/06/2005 Document Revised: 07/28/2015 Elsevier Interactive Patient Education 2016 Elsevier Inc. Vim x??ng kh?p (Osteoarthritis) Vim x??ng kh?p l m?t b?nh gy ?au v vim kh?p. B?nh x?y  ra khi s?n ? ch? kh?p vim b? mn. S?n c tc d?ng nh? m?t t?m n?m, b?c cc ??u x??ng, n?i chng g?p nhau ?? t?o thnh kh?p. Vim x??ng kh?p l d?ng ph? bi?n nh?t c?a vim kh?p. B?nh th??ng g?p ? ng??i cao tu?i. Cc kh?p th??ng b? ?nh h??ng b?i tnh tr?ng ny nh?t ?:  Cc ??u  ngn tay.  Ngn tay ci.  C?.  Th?t l?ng.  ??u g?i.  Hng. NGUYN NHN  Theo th?i gian, ph?n s?n b?c cc ??u x??ng b?t ??u mn d?n. ?i?u ny lm cho x??ng c? vo x??ng, gy ?au v c?ng ? cc kh?p b? ?nh h??ng.  CC Y?U T? NGUY C? M?t s? y?u t? nh?t ??nh c th? lm t?ng kh? n?ng b? vim x??ng kh?p, bao g?m:  Tu?i cao.  Tr?ng l??ng c? th? th?a.  S? d?ng cc kh?p qu m?c.  Tr??c ?y b? th??ng ? kh?p. D?U HI?U V TRI?U CH?NG   ?au, s?ng v c?ng kh?p.  Theo th?i gian, kh?p c th? b? m?t hnh d?ng bnh th??ng.  M?t l??ng x??ng nh? tch t? (ch?i x??ng) c th? pht tri?n trn cc b? kh?p.  Cc m?nh x??ng ho?c s?n c th? v? ra v tri n?i trong khe kh?p. ?i?u ny c th? gy ?au v t?n th??ng. CH?N ?ON  Chuyn gia ch?m [place] s?c kh?e c?a qu v? s? khm th?c th? v h?i v? nh?ng tri?u ch?ng c?a qu v?. C th? c?n lm cc ki?m tra nh?:  Ch?p X quang kh?p b? ?nh h??ng.  Xt nghi?m mu ?? lo?i tr? cc lo?i vim kh?p khc. Cc ki?m tra b? sung c th? c?n th?c hi?n ?? ch?n ?on tnh tr?ng b?nh. ?I?U TR?  M?c tiu c?a vi?c ?i?u tr? l ki?m sot c?n ?au v c?i thi?n ch?c n?ng kh?p Cc k? ho?ch ?i?u tr? c th? bao g?m:  M?t ch??ng trnh t?p th? d?c ???c ch? ??nh cho php ngh? ng?i v gi?m ?au kh?p.  M?t k? ho?ch ki?m sot cn n?ng.  Cc k? thu?t gi?m ?au nh?:  Ch??m nng v l?nh ?ng cch.  Xung ?i?n ???c chuy?n ??n cc ??u dy th?n kinh d??i da (kch thch h? th?n kinh b?ng ?i?n qua da [TENS]).  Xoa bp.  M?t s? th?c ph?m ch?c n?ng nh?t ??nh.  Thu?c ?? ki?m sot c?n ?au, ch?ng h?n nh?:  Acetaminophen.  Thu?c ch?ng vim khng c steroid (NSAID), ch?ng h?n nh? naproxen.  Thu?c gi?m ?au gy ng? ho?c thu?c c tc d?ng trn h? th?n kinh trung ??ng, ch?ng h?n nh? tramadol.  Corticosteroid. Thu?c ny c th? dng ?? u?ng ho?c tim.  Ph?u thu?t ??t l?i v? tr cc x??ng v gi?m ?au (th? thu?t c?t x??ng) ho?c l?y nh?ng m?nh x??ng v s?n r?i ra. C th? c?n thay kh?p ? giai ?o?n cu?i  c?a vim x??ng kh?p. H??NG D?N CH?M [place] T?I NH   Ch? s? d?ng thu?c theo ch? d?n c?a chuyn gia ch?m [place] s?c kh?e.  Duy tr cn n?ng c l?i cho s?c kh?e. Tun th? theo ch? ??n c?a chuyn gia ch?m [place] s?c kh?e ?? ki?m sot cn n?ng. Vi?c ny c th? bao g?m nh?ng h??ng d?n v? ch? ?? ?n.  T?p th? d?c theo ch? d?n. Chuyn gia ch?m  s?c kh?e c th? gi?i thi?u cc lo?i bi t?p c? th?. Nh?ng thay ??i ny c th? bao g?m:  T?p t?ng c??ng s?c m?nh. Cc bi t?p ny ???c th?c  hi?n ?? t?ng c??ng c? b?p h? tr? cc kh?p b? ?nh h??ng b?i vim kh?p. Cc bi t?p ny c th? ???c th?c hi?n v?i cc qu? t? ho?c cc d?i b?ng t?p ?? thm s?c ch?u ??ng.  Ho?t ??ng aerobic. ?y l nh?ng bi t?p, ch?ng h?n nh? ?i b? nhanh ho?c th? d?c nh?p ?i?u tc ??ng th?p, lm cho tim qu v? ??p m?nh.  Cc ho?t ??ng gip cho kh?p m?m d?o. Nh?ng ho?t ??ng ny gi? cho cc kh?p x??ng m?m de?o.  Cc bi t?p cn b?ng v nhanh nh?n. Cc bi t?p ny gip qu v? duy tr cc k? n?ng s?ng hng ngy.  ?? cho kh?p b? ?nh h??ng ngh? ng?i theo ch? d?n c?a chuyn gia ch?m [place] s?c kh?e.  Tun th? m?i cu?c h?n khm l?i theo ch? d?n c?a chuyn gia ch?m [place] s?c kh?e. ?I KHM N?U:   Da c?a qu v? chuy?n sang mu ??.  Ngoi ?au kh?p, qu v? cn b? pht ban.  Qu v? b? ?au kh?p tr?m tr?ng h?n.  Qu v? b? s?t km v?i ?au kh?p ho?c ?au c?. NGAY L?P T?C ?I KHM N?U:  Qu v? b? st cn ?ng k? ho?c m?t c?m gic ngon mi?ng.  Qu v? ?? m? hi ban ?m. ?? BI?T THM THNG TIN   [employer] of Arthritis and Musculoskeletal and Skin Diseases (Vi?n Vim kh?p v C? x??ng v B?nh da Qu?c gia): www.niams.http://www.myers.net/  [employer] on Aging (Vi?n Lo khoa Qu?c gia): https://walker.com/  American College of Rheumatology (Tr??ng ?o t?o v? Th?p kh?p M?): www.rheumatology.org   Thng tin ny khng nh?m m?c ?ch thay th? cho l?i khuyn m chuyn gia ch?m [place] s?c kh?e ni v?i qu v?. Hy b?o ??m qu v? ph?i th?o lu?n b?t k? v?n ?? g m qu v? c  v?i chuyn gia ch?m [place] s?c kh?e c?a qu v?.   Document Released: 11/06/2005 Document Revised: 11/27/2014 Elsevier Interactive Patient Education [employer]

## 2015-10-01 NOTE — Progress Notes (Signed)
Subjective:    Patient ID: Toma Aranam T Langille, female    DOB: 03/11/1956, 59 y.o.   MRN: [id number]  HPI Ms. Barbette Hairam Heldman, a 59 year old female presents accompanied by interpreter to establish care. She states that she was followed by [facility] prior to losing insurane. Patient has a language barrier to communication, she primarily speaks vietnamese. Patient was previously followed by a cardiologist for occasional episodes of orthostatsis. She has also had a coronary artery bypass graft and hysterectomy. She is status post pericardotomy in 1970, while in [place]. She was previously followed by Dr. Kristeen MissPhilip Nahser, [facility]ologist  [facility]. She states that she did not return for follow up in 6 months due to insurance complaints. She reports a history frequent kidney stones.  She states that last kidney stone was in 2015.   Ms. Francetta FoundMai is also complaining of right shoulder tenderness. She maintains that discomfort has been present for greater than 1 month. She denies previous injury. Pain is worsened by increased activity, lying on right side, and lifting heavy objects. Pain is alleviated by rest. She maintains that she takes Tylenol as needed with moderate relief, last taken 3 days ago. Current pain intensity is 4/10 described as tender. Past Medical History  Diagnosis Date  . Kidney stones    There is no immunization history on file for this patient.  Social History   Social History  . Marital Status: Married    Spouse Name: N/A  . Number of Children: N/A  . Years of Education: N/A   Occupational History  . Not on file.   Social History Main Topics  . Smoking status: Never Smoker   . Smokeless tobacco: Not on file  . Alcohol Use: No  . Drug Use: No  . Sexual Activity: Not on file   Other Topics Concern  . Not on file   Social History Narrative   Review of Systems  Constitutional: Negative.  Negative for fatigue.  Eyes: Negative.   Respiratory: Negative.   Cardiovascular:  Negative.  Negative for chest pain, palpitations and leg swelling.  Gastrointestinal: Positive for constipation. Negative for blood in stool and rectal pain.  Endocrine: Positive for polyuria. Negative for polydipsia and polyphagia.  Genitourinary: Negative.   Musculoskeletal: Positive for myalgias (right shoulder pain) and arthralgias.  Skin: Negative.   Allergic/Immunologic: Negative.   Neurological: Negative.  Negative for dizziness and light-headedness.  Hematological: Negative.   Psychiatric/Behavioral: Negative.  Negative for suicidal ideas and sleep disturbance. The patient is not nervous/anxious.        Objective:   Physical Exam  Constitutional: She is oriented to person, place, and time. She appears well-developed and well-nourished.  HENT:  Head: Normocephalic and atraumatic.  Right Ear: External ear normal.  Left Ear: External ear normal.  Mouth/Throat: Oropharynx is clear and moist.  Eyes: Conjunctivae and EOM are normal. Pupils are equal, round, and reactive to light.  Neck: Normal range of motion. Neck supple.  Cardiovascular: Normal rate, regular rhythm, normal heart sounds and intact distal pulses.   Pulmonary/Chest: Effort normal and breath sounds normal.  Abdominal: Soft. Bowel sounds are normal.  Musculoskeletal: Normal range of motion.       Right shoulder: She exhibits tenderness and pain. She exhibits normal range of motion and no swelling.       Right knee: She exhibits normal range of motion and no swelling.       Left knee: She exhibits normal range of motion and  no swelling.  Neurological: She is alert and oriented to person, place, and time. She has normal reflexes.  Skin: Skin is warm and dry.  Psychiatric: She has a normal mood and affect. Her behavior is normal. Judgment and thought content normal.      BP 149/86 mmHg  Pulse 90  Temp(Src) 98.4 F (36.9 C) (Oral)  Resp 14  Ht  (1.499 m)  Wt 106 lb (48.081 kg)  BMI 21.40 kg/m2 Assessment &  Plan:  1. Hyperlipidemia Patient is a non smoker/non drinker and walks several times per week.  - Lipid Panel  2. H/O pericarditis Patient was previously followed by Dr. Leodis Sias, cardiologist. She is sytatus post pericardotomy in the 1970s while in [place]. She was scheduled for yearly follow up. Will send referral to cardiology. Reviewed previous EKG; will repeat a follow up appointment in 2 months Reviewed echocardiogram; ejection fraction 60-65%  3. H/O orthostatic hypotension Reviewed results, within normal limits - Orthostatic vital signs  4. Right shoulder pain I suspect that pain is related to arthritis being that arthralias are wide spread.   - Sedimentation Rate  5. Arthralgia of both knees  - Sedimentation Rate  6. Routine health maintenance Recommend yearly dental visit Yearly Eye exam  Biannual mammogram - CBC with Differential - Hemoglobin A1c  7. Elevated blood pressure - COMPLETE METABOLIC PANEL WITH GFR - Vitamin D, 25-hydroxy  8. Breast cancer screening - MM DIGITAL SCREENING BILATERAL; Future  9. Visual disturbance Reviewed Snellen eye test results. Will send a referral  - Ambulatory referral to Ophthalmology  10. Need for Tdap vaccination - Tdap vaccine greater than or equal to 7yo IM  11. Need for immunization against influenza  - Flu Vaccine QUAD 36+ mos IM (Fluarix) 12. Language barrier to communication Patient is accompanied by interpreter to assist with communication    RTC: F/u by phone with laboratory results;  F/u for hyperlipidemia in 2 months   Surfside Paone M, FNP

## 2015-10-02 LAB — VITAMIN D 25 HYDROXY (VIT D DEFICIENCY, FRACTURES): VIT D 25 HYDROXY: 14 ng/mL — AB (ref 30–100)

## 2015-10-02 LAB — SEDIMENTATION RATE: SED RATE: 8 mm/h (ref 0–30)

## 2015-10-05 ENCOUNTER — Telehealth: Payer: Self-pay | Admitting: Family Medicine

## 2015-10-05 DIAGNOSIS — E559 Vitamin D deficiency, unspecified: Secondary | ICD-10-CM | POA: Insufficient documentation

## 2015-10-05 DIAGNOSIS — R7303 Prediabetes: Secondary | ICD-10-CM | POA: Insufficient documentation

## 2015-10-05 MED ORDER — VITAMIN D 50 MCG (2000 UT) PO CAPS: 1.0000 | ORAL_CAPSULE | Freq: Every day | ORAL | Status: DC

## 2015-10-05 NOTE — Telephone Encounter (Signed)
Reviewed Labs, vitamin D deficiency. Will start Vitamin D 2000 IU daily. Also reviewed hemoglobin A1C, 6 % (consistent with pre-diabetes), goal is <5.7%. Recommend a lowfat, low carbohydrate diet divided over 5-6 small meals, increase water intake to 6-8 glasses, and 150 minutes per week of cardiovascular exercise.  Will continue statin therapy, total cholesterol is 231, goal is <200.   Meds ordered this encounter  Medications  . Cholecalciferol (VITAMIN D) 2000 UNITS CAPS    Sig: Take 1 capsule (2,000 Units total) by mouth daily.    Dispense:  30 capsule    Refill:  5    Rusti Arizmendi M, FNP

## 2015-10-06 ENCOUNTER — Other Ambulatory Visit: Payer: Self-pay

## 2015-10-06 MED ORDER — ATORVASTATIN CALCIUM 40 MG PO TABS: 40.0000 mg | ORAL_TABLET | Freq: Every day | ORAL | Status: DC

## 2015-10-06 NOTE — Telephone Encounter (Signed)
Refill for lipitor sent into pharmacy. Thanks!  

## 2015-10-06 NOTE — Telephone Encounter (Signed)
Patient called back with significant other who interpreted  for her. Advised of low vitamin d, elevated cholesterol, and elevated hga1c. Advised to start vitamin D supplement as prescribed and to continue cholesterol medication. Advised to follow lowfat/ low-carb/ low cholesterol diet, to drink 6 to 8 glasses of water daily, eat 5 to 6 small meals daily, and to exercise 150 minutes per week. Patient stated understanding and had no other questions. Thanks!

## 2015-10-06 NOTE — Telephone Encounter (Signed)
Called using [employer] ID # [id number]. No answer interpreter left message to return call regarding labs. Thanks!

## 2015-10-08 ENCOUNTER — Other Ambulatory Visit: Payer: Self-pay

## 2015-10-08 DIAGNOSIS — E559 Vitamin D deficiency, unspecified: Secondary | ICD-10-CM

## 2015-10-08 MED ORDER — VITAMIN D 50 MCG (2000 UT) PO CAPS: 1.0000 | ORAL_CAPSULE | Freq: Every day | ORAL | Status: AC

## 2015-11-23 MED FILL — ?ATORVASTATIN 40MG TABLET: 40 | 31 days supply | Qty: 31 | Fill #1

## 2015-12-03 ENCOUNTER — Ambulatory Visit: Payer: No Typology Code available for payment source | Admitting: Family Medicine

## 2015-12-17 ENCOUNTER — Ambulatory Visit: Payer: No Typology Code available for payment source | Admitting: Family Medicine

## 2015-12-21 ENCOUNTER — Other Ambulatory Visit: Payer: Self-pay | Admitting: Family Medicine

## 2015-12-21 DIAGNOSIS — Z1231 Encounter for screening mammogram for malignant neoplasm of breast: Secondary | ICD-10-CM

## 2015-12-22 ENCOUNTER — Other Ambulatory Visit: Payer: Self-pay

## 2015-12-22 DIAGNOSIS — Z1231 Encounter for screening mammogram for malignant neoplasm of breast: Secondary | ICD-10-CM

## 2016-01-07 ENCOUNTER — Ambulatory Visit
Admission: RE | Admit: 2016-01-07 | Discharge: 2016-01-07 | Disposition: A | Discharge status: Home / Self Care | Payer: BLUE CROSS/BLUE SHIELD | Source: Ambulatory Visit | Attending: Family Medicine | Admitting: Family Medicine

## 2016-01-07 DIAGNOSIS — Z1231 Encounter for screening mammogram for malignant neoplasm of breast: Secondary | ICD-10-CM

## 2016-01-12 ENCOUNTER — Other Ambulatory Visit: Payer: Self-pay | Admitting: Family Medicine

## 2016-01-12 DIAGNOSIS — R928 Other abnormal and inconclusive findings on diagnostic imaging of breast: Secondary | ICD-10-CM

## 2016-01-20 ENCOUNTER — Ambulatory Visit
Admission: RE | Admit: 2016-01-20 | Discharge: 2016-01-20 | Disposition: A | Discharge status: Home / Self Care | Payer: BLUE CROSS/BLUE SHIELD | Source: Ambulatory Visit | Attending: Family Medicine | Admitting: Family Medicine

## 2016-01-20 DIAGNOSIS — R928 Other abnormal and inconclusive findings on diagnostic imaging of breast: Secondary | ICD-10-CM

## 2016-02-09 ENCOUNTER — Ambulatory Visit (INDEPENDENT_AMBULATORY_CARE_PROVIDER_SITE_OTHER): Payer: No Typology Code available for payment source | Admitting: Family Medicine

## 2016-02-09 ENCOUNTER — Other Ambulatory Visit: Payer: Self-pay | Admitting: Family Medicine

## 2016-02-09 ENCOUNTER — Encounter: Payer: Self-pay | Admitting: Family Medicine

## 2016-02-09 VITALS — BP 137/88 | HR 101 | Temp 98.1°F | Resp 14 | Ht 59.0 in | Wt 104.0 lb

## 2016-02-09 DIAGNOSIS — Z789 Other specified health status: Secondary | ICD-10-CM | POA: Insufficient documentation

## 2016-02-09 DIAGNOSIS — E785 Hyperlipidemia, unspecified: Secondary | ICD-10-CM

## 2016-02-09 DIAGNOSIS — J069 Acute upper respiratory infection, unspecified: Secondary | ICD-10-CM

## 2016-02-09 DIAGNOSIS — R059 Cough, unspecified: Secondary | ICD-10-CM

## 2016-02-09 DIAGNOSIS — R05 Cough: Secondary | ICD-10-CM

## 2016-02-09 MED ORDER — BENZONATATE 100 MG PO CAPS: 100.0000 mg | ORAL_CAPSULE | Freq: Three times a day (TID) | ORAL | Status: DC | PRN

## 2016-02-09 MED ORDER — ATORVASTATIN CALCIUM 40 MG PO TABS: 40.0000 mg | ORAL_TABLET | Freq: Every day | ORAL | Status: DC

## 2016-02-09 MED ORDER — AZITHROMYCIN 250 MG PO TABS: ORAL_TABLET | ORAL | Status: DC

## 2016-02-09 NOTE — Progress Notes (Signed)
Subjective:    Patient ID: Ana Glenn, female    DOB: 1956/09/02, 60 y.o.   MRN: [id number]  Cough This is a new problem. The current episode started in the past 7 days (Patient reports that she had a cold 1 month ago with a fever. ). The problem has been gradually worsening. The cough is non-productive. Associated symptoms include chest pain, rhinorrhea and a sore throat. Pertinent negatives include no chills, fever, headaches, nasal congestion, postnasal drip, sweats or weight loss. Nothing aggravates the symptoms. She has tried OTC cough suppressant for the symptoms. The treatment provided no relief. There is no history of asthma, bronchiectasis, bronchitis, COPD, emphysema, environmental allergies or pneumonia.    Past Medical History  Diagnosis Date  . Kidney stones    Immunization History  Administered Date(s) Administered  . Influenza,inj,Quad PF,36+ Mos 10/01/2015  . Tdap 10/01/2015    Social History   Social History  . Marital Status: Married    Spouse Name: N/A  . Number of Children: N/A  . Years of Education: N/A   Occupational History  . Not on file.   Social History Main Topics  . Smoking status: Never Smoker   . Smokeless tobacco: Not on file  . Alcohol Use: No  . Drug Use: No  . Sexual Activity: Not on file   Other Topics Concern  . Not on file   Social History Narrative   Review of Systems  Constitutional: Negative.  Negative for fever, chills, weight loss and fatigue.  HENT: Positive for rhinorrhea and sore throat. Negative for postnasal drip.   Eyes: Negative.   Respiratory: Positive for cough.   Cardiovascular: Positive for chest pain. Negative for palpitations and leg swelling.  Gastrointestinal: Positive for constipation. Negative for blood in stool and rectal pain.  Endocrine: Positive for polyuria. Negative for polydipsia and polyphagia.  Genitourinary: Negative.   Musculoskeletal: Negative.   Skin: Negative.   Allergic/Immunologic: Negative.   Negative for environmental allergies.  Neurological: Negative.  Negative for dizziness, light-headedness and headaches.  Hematological: Negative.   Psychiatric/Behavioral: Negative.  Negative for suicidal ideas and sleep disturbance. The patient is not nervous/anxious.        Objective:   Physical Exam  Constitutional: She is oriented to person, place, and time. She appears well-developed and well-nourished.  HENT:  Head: Normocephalic and atraumatic.  Right Ear: External ear normal.  Left Ear: External ear normal.  Mouth/Throat: Oropharynx is clear and moist.  Eyes: Conjunctivae and EOM are normal. Pupils are equal, round, and reactive to light.  Neck: Normal range of motion. Neck supple.  Cardiovascular: Normal rate, regular rhythm, normal heart sounds and intact distal pulses.   Pulmonary/Chest: Effort normal and breath sounds normal.  Abdominal: Soft. Bowel sounds are normal.  Musculoskeletal: Normal range of motion.       Right knee: She exhibits normal range of motion and no swelling.       Left knee: She exhibits normal range of motion and no swelling.  Neurological: She is alert and oriented to person, place, and time. She has normal reflexes.  Skin: Skin is warm and dry.  Psychiatric: She has a normal mood and affect. Her behavior is normal. Judgment and thought content normal.      BP 137/88 mmHg  Pulse 101  Temp(Src) 98.1 F (36.7 C) (Oral)  Resp 14  Ht  (1.499 m)  Wt 104 lb (47.174 kg)  BMI 20.99 kg/m2 Assessment & Plan:   1. Hyperlipidemia  Patient reports that she is out of medication. She did not call in Rx for pick-up. Explained the importance of calling in refills.  - atorvastatin (LIPITOR) 40 MG tablet; Take 1 tablet (40 mg total) by mouth daily.  Dispense: 90 tablet; Refill: 1  2. Acute upper respiratory infection Recommend that patient increases fluid intake, rest, and handwashing.  - azithromycin (ZITHROMAX) 250 MG tablet; Take 500 mg today; Take  250 mg on days 2-5  Dispense: 6 tablet; Refill: 0  3. Cough Recommend humidifying air and increasing.  - benzonatate (TESSALON) 100 MG capsule; Take 1 capsule (100 mg total) by mouth 3 (three) times daily as needed for cough.  Dispense: 30 capsule; Refill: 0  4. Language barrier to communication Used [place] interpreter to assist with communication.   RTC: 3 months for hyperlipidemia   Massie MaroonHollis,Jeanne Terrance M, FNP

## 2016-02-09 NOTE — Patient Instructions (Signed)
Nhi?m trng ???ng h h?p trn, Ng??i l?n (Upper Respiratory Infection, Adult) H?u h?t nhi?m trng ???ng h h?p trn (URI) l b?nh nhi?m do vi rt ? ???ng d?n kh ??n ph?i. URI ?nh h??ng ??n m?i, h?ng v ???ng d?n kh trn. Lo?i URI ph? bi?n nh?t l vim m?i h?ng v th??ng ???c cho l "c?m l?nh thng th??ng". URI ti?n tri?n v th??ng s? t? kh?i. Thng th??ng URI khng c?n ?i?u tr?, nh?ng ?i khi ti?p theo nhi?m vi rt l nhi?m khu?n ???ng h h?p trn. Tnh tr?ng ny g?i l nhi?m trng th? pht. Nhi?m trng xoang v tai gi?a l nh?ng lo?i nhi?m trng h h?p trn th? pht ph? bi?n nh?t. Vim ph?i do vi khu?n c?ng c th? lm cho URI tr?m tr?ng h?n. URI c th? lm cho b?nh hen suy?n v b?nh ph?i t?c ngh?n m?n tnh (COPD) tr?m tr?ng h?n. ?i khi, nh?ng bi?n ch?ng ny c th? c?n ph?i ???c ?i?u tr? c?p c?u n?i khoa v c th? ?e d?a tnh m?ng.  NGUYN NHN H?u h?t t?t c? cc tr??ng h?p URI ??u do vi rt gy ra. Vi rt l m?t lo?i m?m b?nh v c th? ly lan t? ng??i ny sang ng??i khc.  CC Y?U T? NGUY C? Qu v? c th? c nguy c? b? URI n?u:   Qu v? ht thu?c l.  Qu v? b? b?nh tim ho?c b?nh ph?i m?n tnh.  Qu v? c h? th?ng phng v? (mi?n d?ch) suy y?u.  Qu v? r?t tr? ho?c r?t gi.  Qu v? b? d? ?ng ? m?i ho?c b? hen suy?n.  Qu v? lm vi?c trong cc khu v?c ?ng ?c ho?c thng kh km.  Qu v? lm vi?c trong cc trung tm ch?m [place] s?c kh?e ho?c tr??ng h?c. D?U HI?U V TRI?U CH?NG  Cc tri?u ch?ng th??ng pht tri?n sau khi qu v? ti?p xc v?i vi rt c?m l?nh 2-3 ngy. H?u h?t cc tr??ng h?p URI do vi rt ko di 7-10 ngy. Tuy nhin, cc tr??ng h?p URI do vi rt cm (vi rt cm) c th? ko di 14 - 18 ngy v th??ng n?ng h?n. Tri?u ch?ng c th? bao g?m:   Ch?y n??c m?i ho?c ng?t (ngh?t) m?i.  H?t h?i.  Ho.  ?au h?ng.  ?au ??u.  M?t m?i.  S?t.  ?n khng ngon mi?ng.  ?au ? trn, pha sau m?t v trn x??ng g m (?au xoang).  ?au nh?c c?. CH?N ?ON  Chuyn gia ch?m [place] s?c kh?e  c th? ch?n ?on URI b?ng cch:  Khm th?c th?.  Ki?m tra ?? kh?ng ??nh cc tri?u ch?ng khng ph?i l do m?t tnh tr?ng khc, ch?ng h?n nh?:  Vim h?ng do lin c?u khu?n.  Vim xoang.  Vim ph?i.  Hen suy?n. ?I?U TR?  URI t? kh?i sau m?t th?i gian. B?nh khng th? ch?a kh?i ???c b?ng thu?c, nh?ng thu?c c th? ???c k ??n v khuy?n ngh? dng ?? lm gi?m cc tri?u ch?ng. Thu?c c th? gip:  H? s?t.  Gi?m ho.  Gi?m ngh?t m?i. H??NG D?N CH?M [place] T?I NH   Ch? s? d?ng thu?c theo ch? d?n c?a chuyn gia ch?m [place] s?c kh?e.  Xc mi?ng b?ng n??c mu?i ?m ho?c dng thu?c gi?m ho ?? lm d?u h?ng qu v? theo ch? d?n c?a chuyn gia ch?m [place] s?c kh?e.  S? d?ng my t?o ?? ?m b?ng s??ng m ?m ho?c ht h?i n??c t? m?t vi  sen ?? t?ng ?? ?m khng kh. ?i?u ny c th? gip d? th? h?n.  U?ng ?? n??c ?? gi? cho n??c ti?u trong ho?c c mu vng nh?t.  ?n sp ho?c cc lo?i n??c canh trong khc v duy tr ch? ?? dinh d??ng t?t.  Ngh? ng?i khi c?n.  Tr? l?i lm vi?c khi nhi?t ?? c?a qu v? ? tr? l?i bnh th??ng ho?c theo chuyn gia ch?m [place] s?c kh?e h??ng d?n. Qu v? c th? c?n ? nh lu h?n ?? trnh ly nhi?m cho ng??i khc. Qu v? c?ng c th? s? d?ng m?t n? v r?a tay c?n th?n ?? ng?n ng?a ly lan vi rt.  T?ng c??ng s? d?ng ?ng thu?c ht n?u qu v? b? b?nh hen suy?n.  Khng s? d?ng b?t c? cc s?n ph?m thu?c l no, bao g?m thu?c l d?ng ht, thu?c l d?ng nhai ho?c thu?c l ?i?n t?. N?u qu v? c?n gip ?? ?? cai thu?c, hy h?i chuyn gia ch?m [place] s?c kh?e. PHNG NG?A  Cch t?t nh?t ?? b?o v? b?n thn kh?i b? c?m l?nh l th?c hnh v? sinh t?t.   Hessie Diener ti?p xc b?ng mi?ng ho?c b?ng tay v?i nh?ng ng??i c tri?u ch?ng c?m l?nh.  R?a tay th??ng xuyn n?u c ti?p xc. Khng c b?ng ch?ng r rng v? vi?c vitamin C, vitamin E, echinacea ho?c t?p th? d?c lm gi?m kh? n?ng b? c?m l?nh. Tuy nhin, qu v? lun c?n ngh? ng?i nhi?u, t?p th? d?c v c ch? ?? dinh d??ng t?t.  ?I KHM N?U:   Qu v? b? tr?m  tr?ng h?n ch? khng ?? h?n.  Tri?u ch?ng c?a qu v? khng ki?m sot ???c b?ng thu?c.  Qu v? b? ?n l?nh.  Qu v? b? kh th? h?n.  Qu v? c ??m mu nu ho?c ??Sander Nephew v? ti?t d?ch mu vng ho?c mu nu ? m?i.  Qu v? b? ?au ? m?t, ??c bi?t l khi qu v? ci v? pha tr??c.  Qu v? b? s?t.  Qu v? b? s?ng h?ch c?.  Qu v? th?y ?au khi nu?t.  Qu v? c nh?ng vng mu tr?ng ? thnh sau h?ng. NGAY L?P T?C ?I KHM N?U:   Qu v? b? n?ng v lin t?c:  ?au ??u.  ?au tai.  ?au xoang.  ?au ng?c.  Qu v? b? b?nh ph?i m?n tnh v b?t k? tnh tr?ng no sau ?y:  Th? kh kh.  Ho ko di.  Ho ra mu.  Thay ??i s? l??ng v mu s?c ??m thng th??ng c?a qu v?.  Qu v? b? c?ng c?.  Qu v? c nh?ng thay ??i v?:  Th? l?c.  Thnh l?c.  Suy ngh?.  Tm tnh. ??M B?O QU V?:   Hi?u r cc h??ng d?n ny.  S? theo di tnh tr?ng c?a mnh.  S? yu c?u tr? gip ngay l?p t?c n?u qu v? c?m th?y khng kh?e ho?c th?y tr?m tr?ng h?n.   Thng tin ny khng nh?m m?c ?ch thay th? cho l?i khuyn m chuyn gia ch?m [place] s?c kh?e ni v?i qu v?. Hy b?o ??m qu v? ph?i th?o lu?n b?t k? v?n ?? g m qu v? c v?i chuyn gia ch?m  s?c kh?e c?a qu v?.   Document Released: 05/22/2011 Document Revised: 03/23/2015 Elsevier Interactive Patient Education 2016 [place] ???ng h h?p trn, Ng??i l?n (Upper Respiratory Infection, Adult) H?u h?t nhi?m trng ???ng h h?p trn (  URI) l b?nh nhi?m do vi rt ? ???ng d?n kh ??n ph?i. URI ?nh h??ng ??n m?i, h?ng v ???ng d?n kh trn. Lo?i URI ph? bi?n nh?t l vim m?i h?ng v th??ng ???c cho l "c?m l?nh thng th??ng". URI ti?n tri?n v th??ng s? t? kh?i. Thng th??ng URI khng c?n ?i?u tr?, nh?ng ?i khi ti?p theo nhi?m vi rt l nhi?m khu?n ???ng h h?p trn. Tnh tr?ng ny g?i l nhi?m trng th? pht. Nhi?m trng xoang v tai gi?a l nh?ng lo?i nhi?m trng h h?p trn th? pht ph? bi?n nh?t. Vim ph?i do vi khu?n c?ng c th?  lm cho URI tr?m tr?ng h?n. URI c th? lm cho b?nh hen suy?n v b?nh ph?i t?c ngh?n m?n tnh (COPD) tr?m tr?ng h?n. ?i khi, nh?ng bi?n ch?ng ny c th? c?n ph?i ???c ?i?u tr? c?p c?u n?i khoa v c th? ?e d?a tnh m?ng.  NGUYN NHN H?u h?t t?t c? cc tr??ng h?p URI ??u do vi rt gy ra. Vi rt l m?t lo?i m?m b?nh v c th? ly lan t? ng??i ny sang ng??i khc.  CC Y?U T? NGUY C? Qu v? c th? c nguy c? b? URI n?u:   Qu v? ht thu?c l.  Qu v? b? b?nh tim ho?c b?nh ph?i m?n tnh.  Qu v? c h? th?ng phng v? (mi?n d?ch) suy y?u.  Qu v? r?t tr? ho?c r?t gi.  Qu v? b? d? ?ng ? m?i ho?c b? hen suy?n.  Qu v? lm vi?c trong cc khu v?c ?ng ?c ho?c thng kh km.  Qu v? lm vi?c trong cc trung tm ch?m [place] s?c kh?e ho?c tr??ng h?c. D?U HI?U V TRI?U CH?NG  Cc tri?u ch?ng th??ng pht tri?n sau khi qu v? ti?p xc v?i vi rt c?m l?nh 2-3 ngy. H?u h?t cc tr??ng h?p URI do vi rt ko di 7-10 ngy. Tuy nhin, cc tr??ng h?p URI do vi rt cm (vi rt cm) c th? ko di 14 - 18 ngy v th??ng n?ng h?n. Tri?u ch?ng c th? bao g?m:   Ch?y n??c m?i ho?c ng?t (ngh?t) m?i.  H?t h?i.  Ho.  ?au h?ng.  ?au ??u.  M?t m?i.  S?t.  ?n khng ngon mi?ng.  ?au ? trn, pha sau m?t v trn x??ng g m (?au xoang).  ?au nh?c c?. CH?N ?ON  Chuyn gia ch?m [place] s?c kh?e c th? ch?n ?on URI b?ng cch:  Khm th?c th?.  Ki?m tra ?? kh?ng ??nh cc tri?u ch?ng khng ph?i l do m?t tnh tr?ng khc, ch?ng h?n nh?:  Vim h?ng do lin c?u khu?n.  Vim xoang.  Vim ph?i.  Hen suy?n. ?I?U TR?  URI t? kh?i sau m?t th?i gian. B?nh khng th? ch?a kh?i ???c b?ng thu?c, nh?ng thu?c c th? ???c k ??n v khuy?n ngh? dng ?? lm gi?m cc tri?u ch?ng. Thu?c c th? gip:  H? s?t.  Gi?m ho.  Gi?m ngh?t m?i. H??NG D?N CH?M [place] T?I NH   Ch? s? d?ng thu?c theo ch? d?n c?a chuyn gia ch?m [place] s?c kh?e.  Xc mi?ng b?ng n??c mu?i ?m ho?c dng thu?c gi?m ho ?? lm d?u h?ng qu v? theo  ch? d?n c?a chuyn gia ch?m [place] s?c kh?e.  S? d?ng my t?o ?? ?m b?ng s??ng m ?m ho?c ht h?i n??c t? m?t vi sen ?? t?ng ?? ?m khng kh. ?i?u ny c th? gip d? th? h?n.  U?ng ?? n??c ??  gi? cho n??c ti?u trong ho?c c mu vng nh?t.  ?n sp ho?c cc lo?i n??c canh trong khc v duy tr ch? ?? dinh d??ng t?t.  Ngh? ng?i khi c?n.  Tr? l?i lm vi?c khi nhi?t ?? c?a qu v? ? tr? l?i bnh th??ng ho?c theo chuyn gia ch?m [place] s?c kh?e h??ng d?n. Qu v? c th? c?n ? nh lu h?n ?? trnh ly nhi?m cho ng??i khc. Qu v? c?ng c th? s? d?ng m?t n? v r?a tay c?n th?n ?? ng?n ng?a ly lan vi rt.  T?ng c??ng s? d?ng ?ng thu?c ht n?u qu v? b? b?nh hen suy?n.  Khng s? d?ng b?t c? cc s?n ph?m thu?c l no, bao g?m thu?c l d?ng ht, thu?c l d?ng nhai ho?c thu?c l ?i?n t?. N?u qu v? c?n gip ?? ?? cai thu?c, hy h?i chuyn gia ch?m [place] s?c kh?e. PHNG NG?A  Cch t?t nh?t ?? b?o v? b?n thn kh?i b? c?m l?nh l th?c hnh v? sinh t?t.   Hessie Diener ti?p xc b?ng mi?ng ho?c b?ng tay v?i nh?ng ng??i c tri?u ch?ng c?m l?nh.  R?a tay th??ng xuyn n?u c ti?p xc. Khng c b?ng ch?ng r rng v? vi?c vitamin C, vitamin E, echinacea ho?c t?p th? d?c lm gi?m kh? n?ng b? c?m l?nh. Tuy nhin, qu v? lun c?n ngh? ng?i nhi?u, t?p th? d?c v c ch? ?? dinh d??ng t?t.  ?I KHM N?U:   Qu v? b? tr?m tr?ng h?n ch? khng ?? h?n.  Tri?u ch?ng c?a qu v? khng ki?m sot ???c b?ng thu?c.  Qu v? b? ?n l?nh.  Qu v? b? kh th? h?n.  Qu v? c ??m mu nu ho?c ??Sander Nephew v? ti?t d?ch mu vng ho?c mu nu ? m?i.  Qu v? b? ?au ? m?t, ??c bi?t l khi qu v? ci v? pha tr??c.  Qu v? b? s?t.  Qu v? b? s?ng h?ch c?.  Qu v? th?y ?au khi nu?t.  Qu v? c nh?ng vng mu tr?ng ? thnh sau h?ng. NGAY L?P T?C ?I KHM N?U:   Qu v? b? n?ng v lin t?c:  ?au ??u.  ?au tai.  ?au xoang.  ?au ng?c.  Qu v? b? b?nh ph?i m?n tnh v b?t k? tnh tr?ng no sau ?y:  Th? kh kh.  Ho ko di.  Ho ra  mu.  Thay ??i s? l??ng v mu s?c ??m thng th??ng c?a qu v?.  Qu v? b? c?ng c?.  Qu v? c nh?ng thay ??i v?:  Th? l?c.  Thnh l?c.  Suy ngh?.  Tm tnh. ??M B?O QU V?:   Hi?u r cc h??ng d?n ny.  S? theo di tnh tr?ng c?a mnh.  S? yu c?u tr? gip ngay l?p t?c n?u qu v? c?m th?y khng kh?e ho?c th?y tr?m tr?ng h?n.   Thng tin ny khng nh?m m?c ?ch thay th? cho l?i khuyn m chuyn gia ch?m [place] s?c kh?e ni v?i qu v?. Hy b?o ??m qu v? ph?i th?o lu?n b?t k? v?n ?? g m qu v? c v?i chuyn gia ch?m  s?c kh?e c?a qu v?.   Document Released: 05/22/2011 Document Revised: 03/23/2015 Elsevier Interactive Patient Education [employer].

## 2016-03-12 IMAGING — MG MM DIAG BREAST TOMO UNI LEFT
7 series · 9 of 15 positions shown · non-contrast
Comparison: None

CLINICAL DATA: The patient was called back from screening
mammography due to a left focal asymmetry.

EXAM:
DIGITAL DIAGNOSTIC LEFT MAMMOGRAM WITH 3D TOMOSYNTHESIS WITH CAD
ULTRASOUND LEFT BREAST

[L CC (1 of 2)]
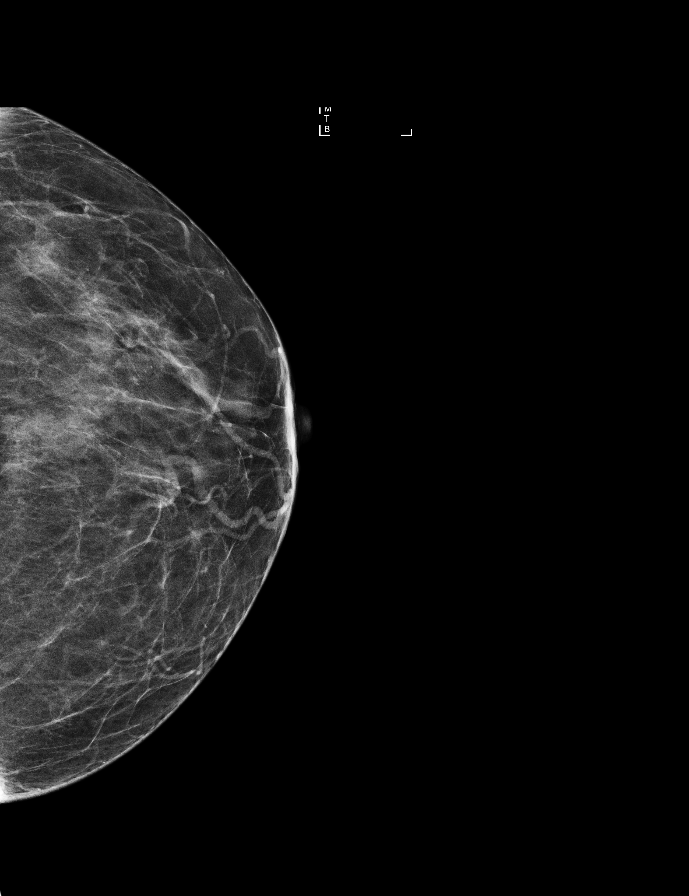

[L CC (2 of 2)]
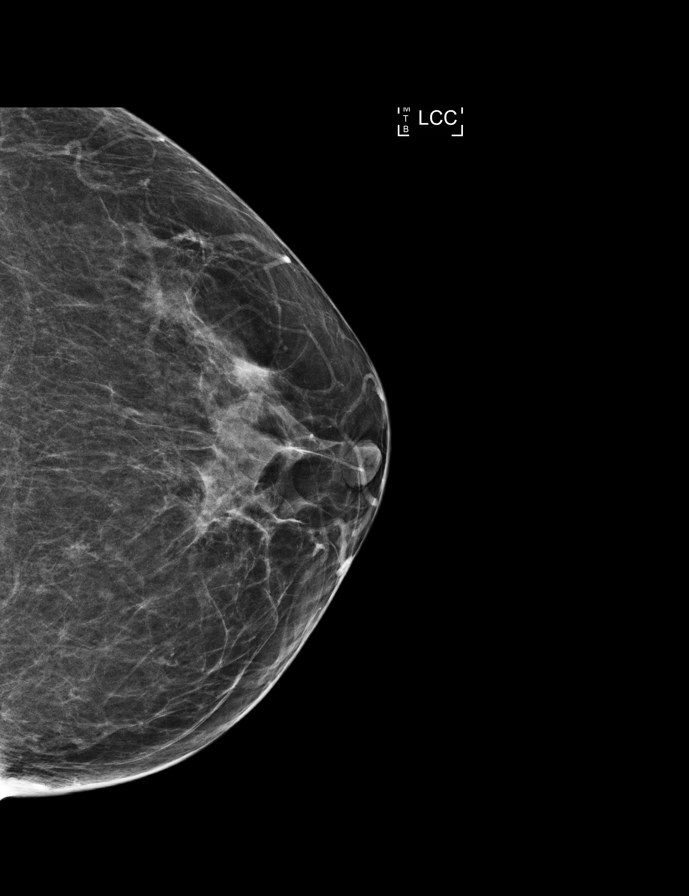

[L CC synth-2D]
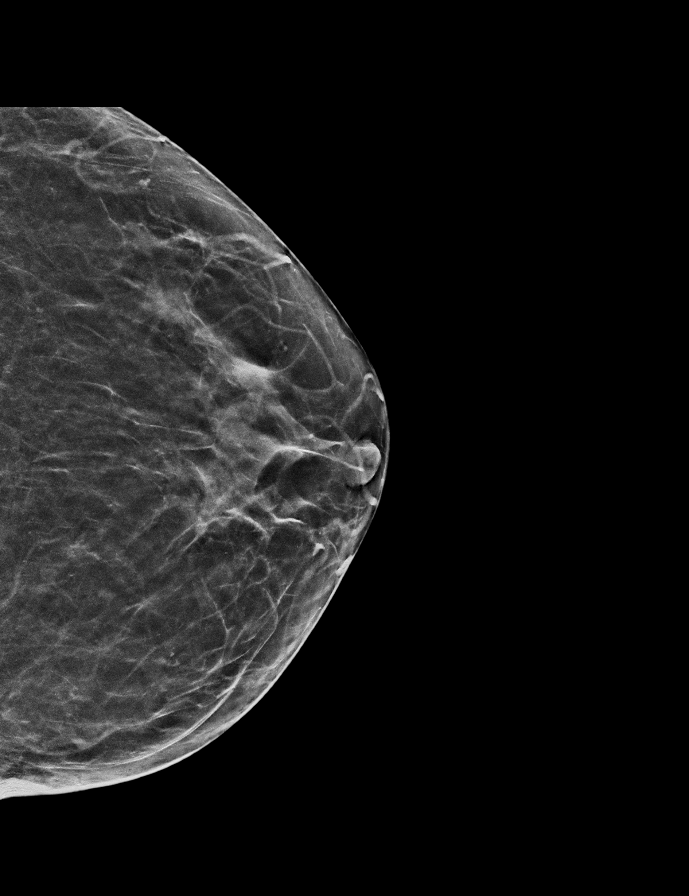

[L MLO synth-2D]
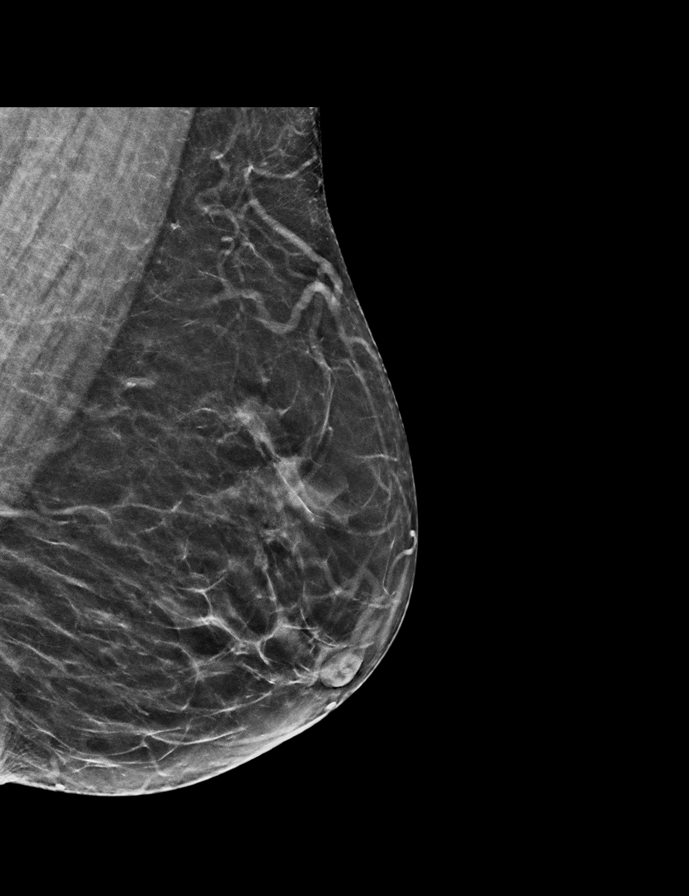

[L MLO]
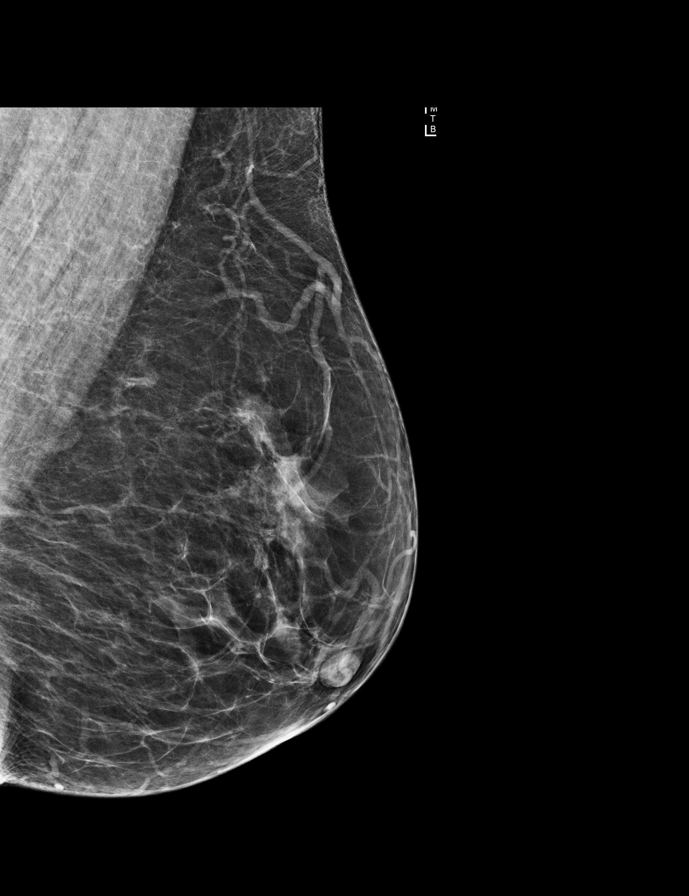

[L MLO tomo · 3 of 51 frames shown]
[frame 17/51]
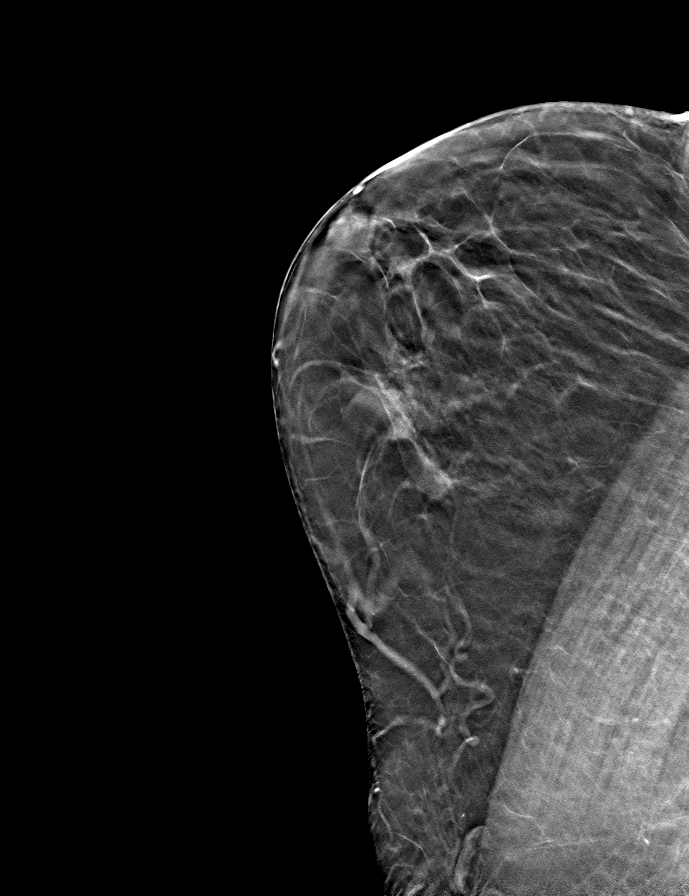
[frame 26/51]
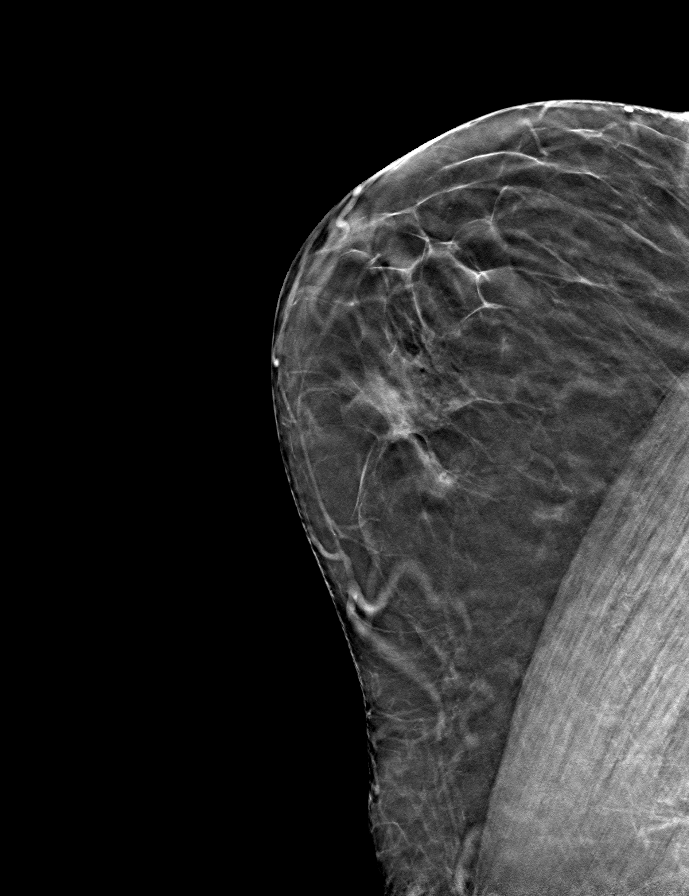
[frame 35/51]
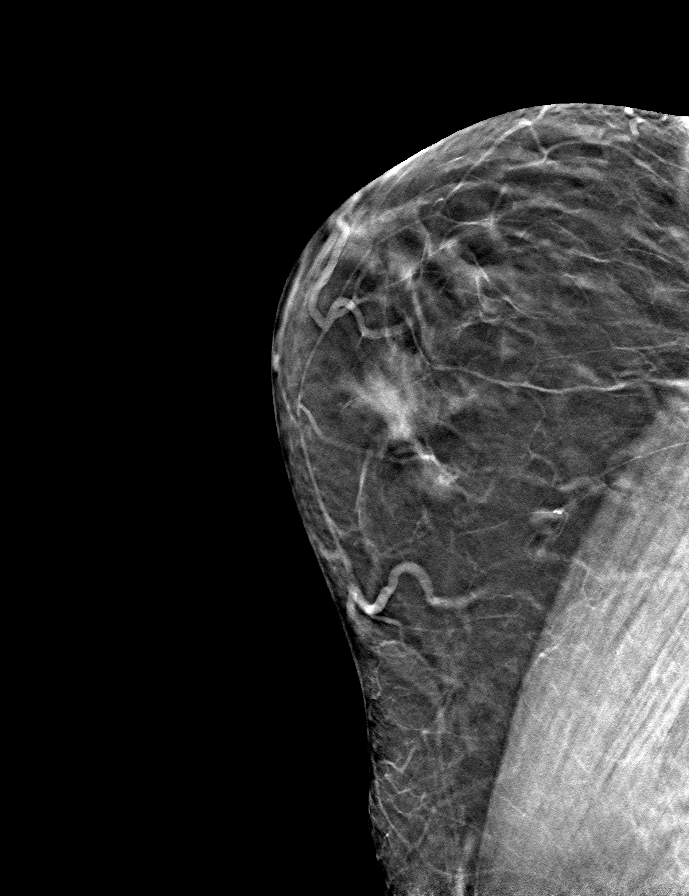

[L CC tomo · tomo slice 25/49.0]
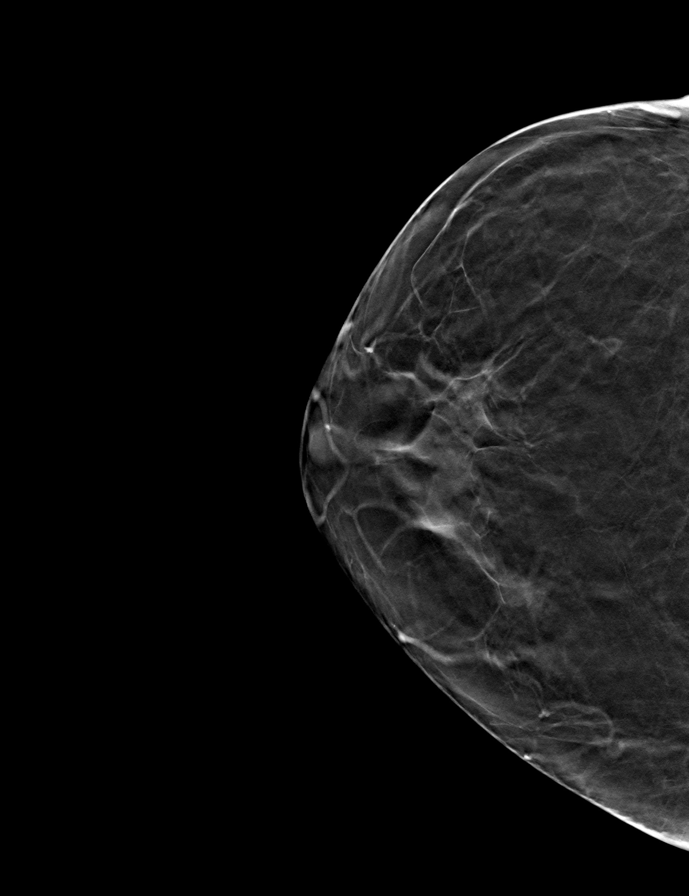

[9 of 15 positions shown; findings below may reference images not displayed]

ACR Breast Density Category b: There are scattered areas of
fibroglandular density.
FINDINGS: The left focal asymmetry appears to represent an island of dense
glandular tissue on 3D imaging. No other suspicious findings.

Mammographic images were processed with CAD.

On physical exam, no suspicious lumps identified.

Targeted ultrasound is performed, showing an island of glandular
tissue correlating with the mammographic findings.
IMPRESSION: No mammographic or sonographic evidence of malignancy.

RECOMMENDATION:
Annual screening mammography.

I have discussed the findings and recommendations with the patient.
Results were also provided in writing at the conclusion of the
visit. If applicable, a reminder letter will be sent to the patient
regarding the next appointment.

BI-RADS CATEGORY  2: Benign.

## 2016-05-11 ENCOUNTER — Ambulatory Visit: Payer: BLUE CROSS/BLUE SHIELD | Admitting: Family Medicine

## 2016-08-29 ENCOUNTER — Encounter: Payer: Self-pay | Admitting: Physician Assistant

## 2016-08-29 ENCOUNTER — Ambulatory Visit (INDEPENDENT_AMBULATORY_CARE_PROVIDER_SITE_OTHER): Payer: BLUE CROSS/BLUE SHIELD | Admitting: Physician Assistant

## 2016-08-29 ENCOUNTER — Ambulatory Visit (INDEPENDENT_AMBULATORY_CARE_PROVIDER_SITE_OTHER): Payer: BLUE CROSS/BLUE SHIELD

## 2016-08-29 VITALS — BP 112/72 | HR 102 | Temp 98.9°F | Resp 17 | Ht 60.0 in | Wt 116.0 lb

## 2016-08-29 DIAGNOSIS — M7581 Other shoulder lesions, right shoulder: Secondary | ICD-10-CM | POA: Diagnosis not present

## 2016-08-29 DIAGNOSIS — M25511 Pain in right shoulder: Secondary | ICD-10-CM | POA: Diagnosis not present

## 2016-08-29 DIAGNOSIS — E7800 Pure hypercholesterolemia, unspecified: Secondary | ICD-10-CM | POA: Diagnosis not present

## 2016-08-29 DIAGNOSIS — Z8679 Personal history of other diseases of the circulatory system: Secondary | ICD-10-CM

## 2016-08-29 DIAGNOSIS — Z23 Encounter for immunization: Secondary | ICD-10-CM | POA: Diagnosis not present

## 2016-08-29 LAB — LIPID PANEL
CHOL/HDL RATIO: 2.7 ratio (ref <=5.0)
Cholesterol: 152 mg/dL (ref 125–200)
HDL: 57 mg/dL (ref >=46)
LDL CALC: 75 mg/dL (ref <130)
Triglycerides: 100 mg/dL (ref <150)
VLDL: 20 mg/dL (ref <30)

## 2016-08-29 MED ORDER — MELOXICAM 7.5 MG PO TABS: 7.5000 mg | ORAL_TABLET | Freq: Every day | ORAL | 1 refills | Status: DC

## 2016-08-29 MED ORDER — ATORVASTATIN CALCIUM 80 MG PO TABS: 80.0000 mg | ORAL_TABLET | Freq: Every day | ORAL | 0 refills | Status: DC

## 2016-08-29 NOTE — Patient Instructions (Addendum)
[facility]: Nahser Roslyn Smiling MD ?   Internist in [place], [place]  Address: [address] [place], [place], [place] [address]  Phone: [phone]    IF you received an x-ray today, you will receive an invoice from [facility]. Please contact [facility] at [phone] with questions or concerns regarding your invoice.   IF you received labwork today, you will receive an invoice from [employer]. Please contact [facility] at [phone] with questions or concerns regarding your invoice.   Our billing staff will not be able to assist you with questions regarding bills from these companies.  You will be contacted with the lab results as soon as they are available. The fastest way to get your results is to activate your My Chart account. Instructions are located on the last page of this paperwork. If you have not heard from [place] regarding the results in 2 weeks, please contact this office.      Rotator Cuff Tendinitis Rotator cuff tendinitis is inflammation of the tough, cord-like bands that connect muscle to bone (tendons) in your rotator cuff. Your rotator cuff is the collection of all the muscles and tendons that connect your arm to your shoulder. Your rotator cuff holds the head of your upper arm bone (humerus) in the cup (fossa) of your shoulder blade (scapula). CAUSES Rotator cuff tendinitis is usually caused by overusing the joint involved.  SIGNS AND SYMPTOMS  Deep ache in the shoulder also felt on the outside upper arm over the shoulder muscle.  Point tenderness over the area that is injured.  Pain comes on gradually and becomes worse with lifting the arm to the side (abduction) or turning it inward (internal rotation).  May lead to a chronic tear: When a rotator cuff tendon becomes inflamed, it runs the risk of losing its blood supply, causing some tendon fibers to die. This increases the risk that the tendon  can fray and partially or completely tear. DIAGNOSIS Rotator cuff tendinitis is diagnosed by taking a medical history, performing a physical exam, and reviewing results of imaging exams. The medical history is useful to help determine the type of rotator cuff injury. The physical exam will include looking at the injured shoulder, feeling the injured area, and watching you do range-of-motion exercises. X-ray exams are typically done to rule out other causes of shoulder pain, such as fractures. MRI is the imaging exam usually used for significant shoulder injuries. Sometimes a dye study called CT arthrogram is done, but it is not as widely used as MRI. In some institutions, special ultrasound tests may also be used to aid in the diagnosis. TREATMENT  Less Severe Cases  Use of a sling to rest the shoulder for a short period of time. Prolonged use of the sling can cause stiffness, weakness, and loss of motion of the shoulder joint.  Anti-inflammatory medicines, such as ibuprofen or naproxen sodium, may be prescribed. More Severe Cases  Physical therapy.  Use of steroid injections into the shoulder joint.  Surgery. HOME CARE INSTRUCTIONS   Use a sling or splint until the pain decreases. Prolonged use of the sling can cause stiffness, weakness, and loss of motion of the shoulder joint.  Apply ice to the injured area:  Put ice in a plastic bag.  Place a towel between your skin and the bag.  Leave the ice on for 20 minutes, 2-3 times a day.  Try to avoid use other than gentle range of motion while your  shoulder is painful. Use the shoulder and exercise only as directed by your health care provider. Stop exercises or range of motion if pain or discomfort increases, unless directed otherwise by your health care provider.  Only take over-the-counter or prescription medicines for pain, discomfort, or fever as directed by your health care provider.  If you were given a shoulder sling and straps  (immobilizer), do not remove it except as directed, or until you see a health care provider for a follow-up exam. If you need to remove it, move your arm as little as possible or as directed.  You may want to sleep on several pillows at night to lessen swelling and pain. SEEK IMMEDIATE MEDICAL CARE IF:   Your shoulder pain increases or new pain develops in your arm, hand, or fingers and is not relieved with medicines.  You have new, unexplained symptoms, especially increased numbness in the hands or loss of strength.  You develop any worsening of the problems that brought you in for care.  Your arm, hand, or fingers are numb or tingling.  Your arm, hand, or fingers are swollen, painful, or turn white or blue. MAKE SURE YOU:  Understand these instructions.  Will watch your condition.  Will get help right away if you are not doing well or get worse.   This information is not intended to replace advice given to you by your health care provider. Make sure you discuss any questions you have with your health care provider.   Document Released: 01/27/2004 Document Revised: 11/27/2014 Document Reviewed: 06/18/2013 Elsevier Interactive Patient Education [employer]2016 Elsevier Inc.

## 2016-08-29 NOTE — Addendum Note (Signed)
Addended by: Morrell RiddleWEBER, SARAH L on: 08/29/2016 06:16 PM   Modules accepted: Orders

## 2016-08-29 NOTE — Progress Notes (Signed)
Ana Glenn  MRN: [id number] DOB: 1956-04-21  Subjective:  Pt presents to clinic right shoulder and neck pain for about a month and she would like a flu vaccine. She has had this type of shoulder pain the past but it typically comes and goes spontaneously but over the last month it has really started to bother her.  The pain has worsened over the last month and she cannot think of anything that she did prior to the pain starting.  She is having trouble lifting things due to the pain and she has pain when she is moving her arm.  She is having no paresthesias or weakness in her arm or hand.  She has been using tylenol but she is not noticing much help after a few hours. She does not work - she is right handed. She is having some neck associated with the shoulder pain but it seems unrelated to the patient.  Son-in-law - interprets  Review of Systems  Respiratory: Negative for cough and shortness of breath.   Cardiovascular: Negative for chest pain.    Patient Active Problem List   Diagnosis Date Noted  . Language barrier to communication 02/09/2016  . Vitamin D deficiency 10/05/2015  . Prediabetes 10/05/2015  . H/O orthostatic hypotension 10/01/2015  . Visual disturbance 10/01/2015  . Routine health maintenance 10/01/2015  . Right shoulder pain 10/01/2015  . H/O pericarditis 02/02/2015  . Hyperlipidemia 02/02/2015  . History of kidney stones 12/09/2014    Current Outpatient Prescriptions on File Prior to Visit  Medication Sig Dispense Refill  . acetaminophen (TYLENOL) 500 MG tablet Take 500 mg by mouth every 6 (six) hours as needed.    Marland Kitchen. atorvastatin (LIPITOR) 40 MG tablet Take 1 tablet (40 mg total) by mouth daily. 90 tablet 1  . Cholecalciferol (VITAMIN D) 2000 UNITS CAPS Take 1 capsule (2,000 Units total) by mouth daily. 30 capsule 5   No current facility-administered medications on file prior to visit.     No Known Allergies  Pt patients past, family and social history were  reviewed and updated.  Objective:  BP 112/72 (BP Location: Right Arm, Patient Position: Sitting, Cuff Size: Normal)   Pulse (!) 102   Temp 98.9 F (37.2 C) (Oral)   Resp 17   Ht 5' (1.524 m)   Wt 116 lb (52.6 kg)   SpO2 97%   BMI 22.65 kg/m   Physical Exam  Constitutional: She is oriented to person, place, and time and well-developed, well-nourished, and in no distress.  HENT:  Head: Normocephalic and atraumatic.  Right Ear: Hearing and external ear normal.  Left Ear: Hearing and external ear normal.  Eyes: Conjunctivae are normal.  Neck: Normal range of motion.  Cardiovascular: Normal rate, regular rhythm and normal heart sounds.   No murmur heard. Pulmonary/Chest: Effort normal and breath sounds normal. She has no wheezes.  Musculoskeletal:       Right shoulder: She exhibits bony tenderness, pain (with internal rotation) and spasm (trapezius). She exhibits normal range of motion and no tenderness.  No pain with flexion/extension/abduction/adduction - only pain with internal rotation.  Neg empty can, impingement test positive with internal rotation  Neurological: She is alert and oriented to person, place, and time. Gait normal.  Skin: Skin is warm and dry.  Psychiatric: Mood, memory, affect and judgment normal.  Vitals reviewed.   Dg Shoulder Right  Result Date: 08/29/2016 CLINICAL DATA:  Right shoulder pain, no known injury, initial encounter EXAM: RIGHT SHOULDER -  2+ VIEW COMPARISON:  None. FINDINGS: No acute fracture or dislocation is identified. The underlying bony thorax appears within normal limits. No gross soft tissue abnormality is seen. IMPRESSION: No acute abnormality noted. Electronically Signed   By: Alcide Clever M.D.   On: 08/29/2016 09:28    Assessment and Plan :  Acute pain of right shoulder - Plan: DG Shoulder Right, meloxicam (MOBIC) 7.5 MG tablet - this is a flair of her right shoulder pain - this is likely rotatory cuff tendonitis and should be helped  with PT - she will use ice  Flu vaccine need - Plan: Flu Vaccine QUAD 36+ mos IM  Pure hypercholesterolemia - Plan: Lipid panel - check labs and refill medications - she will contact cardiologist for recheck  Rotator cuff tendonitis, right - Plan: meloxicam (MOBIC) 7.5 MG tablet, Ambulatory referral to Physical Therapy  Benny Lennert PA-C  [facility] 08/29/2016 9:41 AM

## 2016-10-10 ENCOUNTER — Ambulatory Visit (INDEPENDENT_AMBULATORY_CARE_PROVIDER_SITE_OTHER): Payer: BLUE CROSS/BLUE SHIELD | Admitting: Family Medicine

## 2016-10-10 VITALS — BP 128/80 | HR 94 | Temp 97.8°F | Resp 16 | Ht 59.0 in | Wt 116.0 lb

## 2016-10-10 DIAGNOSIS — E7800 Pure hypercholesterolemia, unspecified: Secondary | ICD-10-CM | POA: Diagnosis not present

## 2016-10-10 DIAGNOSIS — Z951 Presence of aortocoronary bypass graft: Secondary | ICD-10-CM | POA: Diagnosis not present

## 2016-10-10 DIAGNOSIS — R7301 Impaired fasting glucose: Secondary | ICD-10-CM | POA: Diagnosis not present

## 2016-10-10 DIAGNOSIS — R002 Palpitations: Secondary | ICD-10-CM

## 2016-10-10 DIAGNOSIS — N200 Calculus of kidney: Secondary | ICD-10-CM | POA: Diagnosis not present

## 2016-10-10 DIAGNOSIS — E559 Vitamin D deficiency, unspecified: Secondary | ICD-10-CM | POA: Diagnosis not present

## 2016-10-10 DIAGNOSIS — Z5181 Encounter for therapeutic drug level monitoring: Secondary | ICD-10-CM

## 2016-10-10 LAB — COMPREHENSIVE METABOLIC PANEL
ALK PHOS: 68 U/L (ref 33–130)
ALT: 27 U/L (ref 6–29)
AST: 28 U/L (ref 10–35)
Albumin: 4.3 g/dL (ref 3.6–5.1)
BUN: 11 mg/dL (ref 7–25)
CO2: 29 mmol/L (ref 20–31)
Calcium: 9.2 mg/dL (ref 8.6–10.4)
Chloride: 103 mmol/L (ref 98–110)
Creat: 0.58 mg/dL (ref 0.50–0.99)
GLUCOSE: 88 mg/dL (ref 65–99)
POTASSIUM: 3.9 mmol/L (ref 3.5–5.3)
Sodium: 140 mmol/L (ref 135–146)
Total Bilirubin: 0.7 mg/dL (ref 0.2–1.2)
Total Protein: 7.5 g/dL (ref 6.1–8.1)

## 2016-10-10 LAB — POCT URINALYSIS DIP (MANUAL ENTRY)
BILIRUBIN UA: NEGATIVE
BILIRUBIN UA: NEGATIVE
GLUCOSE UA: NEGATIVE
Nitrite, UA: NEGATIVE
PH UA: 7.5
Protein Ur, POC: NEGATIVE
Spec Grav, UA: 1.015
Urobilinogen, UA: 0.2

## 2016-10-10 LAB — CBC
HCT: 39.9 % (ref 35.0–45.0)
Hemoglobin: 12.7 g/dL (ref 11.7–15.5)
MCH: 25.6 pg — AB (ref 27.0–33.0)
MCHC: 31.8 g/dL — ABNORMAL LOW (ref 32.0–36.0)
MCV: 80.3 fL (ref 80.0–100.0)
MPV: 10.1 fL (ref 7.5–12.5)
PLATELETS: 268 10*3/uL (ref 140–400)
RBC: 4.97 MIL/uL (ref 3.80–5.10)
RDW: 14.2 % (ref 11.0–15.0)
WBC: 6.3 10*3/uL (ref 3.8–10.8)

## 2016-10-10 LAB — VITAMIN D 25 HYDROXY (VIT D DEFICIENCY, FRACTURES): VIT D 25 HYDROXY: 28 ng/mL — AB (ref 30–100)

## 2016-10-10 LAB — HEMOGLOBIN A1C
HEMOGLOBIN A1C: 5.8 % — AB (ref <5.7)
MEAN PLASMA GLUCOSE: 120 mg/dL

## 2016-10-10 MED ORDER — ATORVASTATIN CALCIUM 80 MG PO TABS: 80.0000 mg | ORAL_TABLET | Freq: Every day | ORAL | 3 refills | Status: AC

## 2016-10-10 NOTE — Progress Notes (Signed)
Chief Complaint  Patient presents with  . med check    HPI  Hyperlipidemia: Patient presents with hyperlipidemia.  She was tested because routine screening and Glenn history of cad.   palpitations. There is Glenn family history of hyperlipidemia. There is not Glenn family history of early ischemia heart disease.  Lab Results  Component Value Date   CHOL 152 08/29/2016   CHOL 231 (H) 10/01/2015   CHOL 240 (H) 12/07/2014   Lab Results  Component Value Date   HDL 57 08/29/2016   HDL 75 10/01/2015   HDL 76 12/07/2014   Lab Results  Component Value Date   LDLCALC 75 08/29/2016   LDLCALC 143 (H) 10/01/2015   LDLCALC 147 (H) 12/07/2014   Lab Results  Component Value Date   TRIG 100 08/29/2016   TRIG 65 10/01/2015   TRIG 87 12/07/2014   Lab Results  Component Value Date   CHOLHDL 2.7 08/29/2016   CHOLHDL 3.1 10/01/2015   CHOLHDL 3.2 12/07/2014   No results found for: LDLDIRECT  Prediabetes She has Glenn history of prediabetes not treated with medications She does exercise with physical therapy twice Glenn week She denies polyuria, polydipsia, polyphagia Wt Readings from Last 3 Encounters:  10/10/16 116 lb (52.6 kg)  08/29/16 116 lb (52.6 kg)  02/09/16 104 lb (47.2 kg)     Vit D insufficiency She is taking her vitamin D supplement 1 capsule daily She has some fatigue but seems to be related to her heart palpitations Last vit d level 14 (10/01/15)  Past Medical History:  Diagnosis Date  . Kidney stones     Current Outpatient Prescriptions  Medication Sig Dispense Refill  . acetaminophen (TYLENOL) 500 MG tablet Take 500 mg by mouth every 6 (six) hours as needed.    Marland Kitchen. atorvastatin (LIPITOR) 80 MG tablet Take 1 tablet (80 mg total) by mouth daily. 90 tablet 3  . Cholecalciferol (VITAMIN D) 2000 UNITS CAPS Take 1 capsule (2,000 Units total) by mouth daily. 30 capsule 5  . meloxicam (MOBIC) 7.5 MG tablet Take 1 tablet (7.5 mg total) by mouth daily. 30 tablet 1   No current  facility-administered medications for this visit.     Allergies: No Known Allergies  Past Surgical History:  Procedure Laterality Date  . ABDOMINAL HYSTERECTOMY    . CORONARY ARTERY BYPASS GRAFT      Social History   Social History  . Marital status: Married    Spouse name: N/Glenn  . Number of children: N/Glenn  . Years of education: N/Glenn   Social History Main Topics  . Smoking status: Never Smoker  . Smokeless tobacco: Never Used  . Alcohol use No  . Drug use: No  . Sexual activity: Not Asked   Other Topics Concern  . None   Social History Narrative  . None    Review of Systems  Constitutional: Negative for chills and fever.  HENT: Negative for hearing loss and tinnitus.   Eyes: Negative for blurred vision and double vision.  Respiratory: Negative for cough and wheezing.   Cardiovascular: Positive for palpitations. Negative for chest pain.  Gastrointestinal: Negative for abdominal pain, nausea and vomiting.  Genitourinary: Positive for frequency. Negative for flank pain and hematuria.  Musculoskeletal: Negative for myalgias and neck pain.  Skin: Negative for itching and rash.  Neurological: Negative for dizziness and headaches.  Psychiatric/Behavioral: Negative for depression. The patient does not have insomnia.     Objective: Vitals:   10/10/16 0824  BP:  128/80  Pulse: 94  Resp: 16  Temp: 97.8 F (36.6 C)  TempSrc: Oral  SpO2: 97%  Weight: 116 lb (52.6 kg)  Height: 4\' 11"  (1.499 m)    Physical Exam  Constitutional: She is oriented to person, place, and time. She appears well-developed and well-nourished.  HENT:  Head: Atraumatic.  Right Ear: External ear normal.  Left Ear: External ear normal.  Eyes: Conjunctivae and EOM are normal.  Neck: Normal range of motion. Neck supple.  Cardiovascular: Normal rate, regular rhythm and normal heart sounds.   No murmur heard. Pulmonary/Chest: Effort normal and breath sounds normal. No respiratory distress. She has  no wheezes.  Abdominal: Soft. Bowel sounds are normal. She exhibits no distension.  Musculoskeletal: Normal range of motion. She exhibits no edema.  Neurological: She is alert and oriented to person, place, and time. No cranial nerve deficit. Coordination normal.  Skin: Skin is warm. Capillary refill takes less than 2 seconds.  Psychiatric: She has Glenn normal mood and affect. Her behavior is normal. Judgment and thought content normal.    Assessment and Plan Ana Glenn was seen today for med check.  Diagnoses and all orders for this visit:  Hx of CABG Heart palpitations -  Continue statin Will check cbc since anemia can worsen palpitations -     CBC  Pure hypercholesterolemia- advised pt to continue statin Reviewed October lipid panel results with pt Refill sent to pharmacy -     Comprehensive metabolic panel -     atorvastatin (LIPITOR) 80 MG tablet; Take 1 tablet (80 mg total) by mouth daily.  Vitamin D insufficiency- last level Glenn year ago was 14 Discussed goal of 30 or higher Continue 2000 units for now but will adjust based on results -     VITAMIN D 25 Hydroxy (Vit-D Deficiency, Fractures)  Impaired fasting glucose- will check Pt may benefit from low dose metformin if level is greater than a1c of 6% -     Hemoglobin A1c -     Comprehensive metabolic panel -     POCT urinalysis dipstick  Encounter for medication monitoring -     VITAMIN D 25 Hydroxy (Vit-D Deficiency, Fractures) -     Comprehensive metabolic panel  Kidney stones-  Pt udip shows moderate blood Has Glenn history of kidney stones that are asymptomatic currently Will monitor   Ana Glenn Ana Glenn

## 2016-10-10 NOTE — Patient Instructions (Addendum)
Nahser, Deloris PingPhilip J, MD  02/02/2015 10:02 AM    [facility] [address], [place], [place]  [address] Phone: [phone](336) [phone]; Fax: [phone]) [phone]    ?nh tr?ng ng?c (Palpitations) ?nh tr?ng ng?c l c?m gic nh?p tim c?a qu v? khng ??u ho?c nhanh h?n bnh th??ng. Qu v? c th? c?m th?y nh? tim mnh rung ln ho?c b? nh?p. ?nh tr?ng ng?c th??ng khng ph?i l m?t v?n ?? nghim tr?ng. Chu?ng co? th? xu?t hi?n do nhi?u y?u t?, bao g?m hu?t thu?c, caffeine, r???u, c?ng th??ng va? m?t s? loa?i thu?c nh?t ??nh. M?c du? h?u h?t ca?c nguyn nhn gy ?a?nh tr?ng ng??c l khng nghim tro?ng, nh?ng ?a?nh tr?ng ng??c co? th? la? m?t d?u hi?u cu?a m?t v?n ?? b?nh l nghim tro?ng. Trong m?t s? tr??ng h?p, qu v? c th? c?n c ?nh gi thm v? m?t y [place]. H??NG D?N CH?M [place] T?I NH Ch  ??n b?t c? thay ??i no v? tri?u ch?ng c?a qu v?. Ti?n hnh nh?ng hnh ??ng sau ?? tr? gip tnh tr?ng ny:  Tra?nh nh??ng th?? sau:  C ph, tr, n??c gi?i kht, thu?c gi?m cn v ?? u?ng giu n?ng l??ng c caffeine.  S c la.  R??u.  Khng s? d?ng b?t k? s?n ph?m thu?c l no, ch??ng ha?n thu?c l d?ng ht, thu?c l d?ng nhai v thu?c l ?i?n t?. N?u qu v? c?n gip ?? ?? cai thu?c, hy h?i chuyn gia ch?m [place] s?c kh?e.  C? g??ng gi?m c?ng th?ng v lo l?ng. Nh?ng th? c th? gip qu v? th? gin bao g?m:  T?p yoga.  Thi?n.  Ho?t ??ng th? ch?t nh? b?i l?i, ch?y b? ho?c ?i b?.  Pha?n h?i sinh ho?c. ?y la? m?t ph??ng pha?p giu?p quy? vi? ho?c ca?ch s?? du?ng tm tri? mi?nh ?? ki?m soa?t ca?c y?u t? trong c? th?, ch??ng ha?n nh? nhi?p tim.  Ngh? ng?i v ng? th?t nhi?u.  Ch? s? d?ng thu?c khng c?n k ??n v thu?c c?n k ??n theo ch? d?n c?a chuyn gia ch?m [place] s?c kh?e.  Tun th? t?t c? cc cu?c h?n khm l?i theo ch? d?n c?a chuyn gia ch?m [place] s?c kh?e. ?i?u ny c vai tr quan tr?ng. ?I KHM N?U:  Qu v? ti?p t?c c nh?p tim nhanh ho?c khng ??u sau 24  ti?ng.  Tri?u ch?ng ?nh tr?ng ng?c c?a qu v? x?y ra th??ng xuyn h?n. NGAY L?P T?C ?I KHM N?U:  Qu v? b? ?au ng?c ho?c kh th?.  Qu v? b? ?au ??u nhi?u.  Qu v? c?m th?y chng m?t ho?c ng?t x?u. Thng tin ny khng nh?m m?c ?ch thay th? cho l?i khuyn m chuyn gia ch?m [place] s?c kh?e ni v?i qu v?. Hy b?o ??m qu v? ph?i th?o lu?n b?t k? v?n ?? g m qu v? c v?i chuyn gia ch?m  s?c kh?e c?a qu v?. Document Released: 11/06/2005 Document Revised: 02/28/2016 Document Reviewed: 07/22/2015 Elsevier Interactive Patient Education  2017 [employer]

## 2016-11-09 ENCOUNTER — Encounter: Payer: Self-pay | Admitting: Physician Assistant

## 2016-11-09 ENCOUNTER — Ambulatory Visit (INDEPENDENT_AMBULATORY_CARE_PROVIDER_SITE_OTHER): Payer: BLUE CROSS/BLUE SHIELD | Admitting: Physician Assistant

## 2016-11-09 VITALS — BP 144/90 | HR 87 | Temp 97.9°F | Ht 59.0 in | Wt 114.0 lb

## 2016-11-09 DIAGNOSIS — R002 Palpitations: Secondary | ICD-10-CM | POA: Diagnosis not present

## 2016-11-09 DIAGNOSIS — J069 Acute upper respiratory infection, unspecified: Secondary | ICD-10-CM | POA: Diagnosis not present

## 2016-11-09 DIAGNOSIS — B9789 Other viral agents as the cause of diseases classified elsewhere: Secondary | ICD-10-CM | POA: Diagnosis not present

## 2016-11-09 MED ORDER — BENZONATATE 100 MG PO CAPS: 100.0000 mg | ORAL_CAPSULE | Freq: Three times a day (TID) | ORAL | 0 refills | Status: DC | PRN

## 2016-11-09 MED ORDER — GUAIFENESIN ER 1200 MG PO TB12: 1.0000 | ORAL_TABLET | Freq: Two times a day (BID) | ORAL | 1 refills | Status: DC | PRN

## 2016-11-09 NOTE — Progress Notes (Signed)
[facility] [address], [place] [address] [phone]  Date:  11/09/2016   Name:  Ana Glenn   DOB:  07/21/1956   MRN:  [id number]  PCP:  Massie MaroonHollis,Lachina M, FNP    History of Present Illness:  Ana Glenn is a 60 y.o. female patient who [facility] cc of cough, nasal congestion. And medication refill.   --nasal congestion with coughing without production.  She has bodyaches and chills.  She has chest palpitations that feels worsening.  She may have sob intermittently.   --coughing --vomiting last night just the food without cough She also reports pa There is no prominent chest pain. She has noticed some shortness of breath at times. This is possibly associated with exertion. History of cabg years ago.  No cardiologist at this time.    Patient Active Problem List   Diagnosis Date Noted  . Language barrier to communication 02/09/2016  . Vitamin D deficiency 10/05/2015  . Prediabetes 10/05/2015  . H/O orthostatic hypotension 10/01/2015  . Visual disturbance 10/01/2015  . Routine health maintenance 10/01/2015  . Right shoulder pain 10/01/2015  . H/O pericarditis 02/02/2015  . Hyperlipidemia 02/02/2015  . History of kidney stones 12/09/2014    Past Medical History:  Diagnosis Date  . Kidney stones     Past Surgical History:  Procedure Laterality Date  . ABDOMINAL HYSTERECTOMY    . CORONARY ARTERY BYPASS GRAFT      Social History  Substance Use Topics  . Smoking status: Never Smoker  . Smokeless tobacco: Never Used  . Alcohol use No    Family History  Problem Relation Age of Onset  . Diabetes Mother   . Cancer Brother     No Known Allergies  Medication list has been reviewed and updated.  Current Outpatient Prescriptions on File Prior to Visit  Medication Sig Dispense Refill  . acetaminophen (TYLENOL) 500 MG tablet Take 500 mg by mouth every 6 (six) hours as needed.    Marland Kitchen. atorvastatin (LIPITOR) 80 MG tablet Take 1 tablet (80 mg  total) by mouth daily. 90 tablet 3  . Cholecalciferol (VITAMIN D) 2000 UNITS CAPS Take 1 capsule (2,000 Units total) by mouth daily. 30 capsule 5  . meloxicam (MOBIC) 7.5 MG tablet Take 1 tablet (7.5 mg total) by mouth daily. 30 tablet 1   No current facility-administered medications on file prior to visit.     ROS ROS otherwise unremarkable unless listed above.  Physical Examination: BP (!) 144/90 (BP Location: Right Arm, Patient Position: Sitting, Cuff Size: Small)   Pulse 87   Temp 97.9 F (36.6 C) (Oral)   Ht 4\' 11"  (1.499 m)   Wt 114 lb (51.7 kg)   SpO2 96%   BMI 23.03 kg/m  Ideal Body Weight: Weight in (lb) to have BMI = 25: 123.5  Physical Exam  Constitutional: She is oriented to person, place, and time. She appears well-developed and well-nourished. No distress.  HENT:  Head: Normocephalic and atraumatic.  Right Ear: Tympanic membrane, external ear and ear canal normal.  Left Ear: Tympanic membrane, external ear and ear canal normal.  Nose: Mucosal edema and rhinorrhea present. Right sinus exhibits no maxillary sinus tenderness and no frontal sinus tenderness. Left sinus exhibits no maxillary sinus tenderness and no frontal sinus tenderness.  Mouth/Throat: No uvula swelling. No oropharyngeal exudate, posterior oropharyngeal edema or posterior oropharyngeal erythema.  Eyes: Conjunctivae and EOM are normal. Pupils are equal, round, and reactive  to light.  Cardiovascular: Normal rate and regular rhythm.  Exam reveals no gallop, no distant heart sounds and no friction rub.   No murmur heard. Pulmonary/Chest: Effort normal. No respiratory distress. She has no decreased breath sounds. She has no wheezes. She has no rhonchi.  Lymphadenopathy:       Head (right side): No submandibular, no tonsillar, no preauricular and no posterior auricular adenopathy present.       Head (left side): No submandibular, no tonsillar, no preauricular and no posterior auricular adenopathy present.   Neurological: She is alert and oriented to person, place, and time.  Skin: She is not diaphoretic.  Psychiatric: She has a normal mood and affect. Her behavior is normal.     Assessment and Plan: Ana Glenn is a 60 y.o. female who is here today for chief complaint of cough and congestion. She is also requesting a medication refill. This appears to be an upper respiratory infection of viral etiology. Will treat her supportively. History of CABG will not refill meloxicam is chronic medicine. Patient declines blood work for these palpitations allowed an EKG which does not appear acute. I've advised him to follow up as soon as possible regarding palpitations where we can test her electrolytes and thyroid.  Discussed risk.  Patient and son-in-law voiced understanding Palpitations - Plan: EKG 12-Lead  Viral upper respiratory infection - Plan: benzonatate (TESSALON) 100 MG capsule, Guaifenesin (MUCINEX MAXIMUM STRENGTH) 1200 MG TB12 Trena PlattStephanie Trinidee Schrag, PA-C [facility] 11/09/2016 11:16 AM

## 2016-11-09 NOTE — Patient Instructions (Addendum)
Please hydrate well with 64 oz of water, and take medications as prescribed. Please go to the emergency department if the palpitations are uncontrolled and increase, shortness of breath, dizziness. You do need to return for blood work.   Nhi?m trng ???ng h h?p trn, Ng??i l?n (Upper Respiratory Infection, Adult) H?u h?t nhi?m trng ???ng h h?p trn (URI) l b?nh nhi?m do vi rt ? ???ng d?n kh ??n ph?i. URI ?nh h??ng ??n m?i, h?ng v ???ng d?n kh trn. Lo?i URI ph? bi?n nh?t l vim m?i h?ng v th??ng ???c cho l "c?m l?nh thng th??ng". URI ti?n tri?n v th??ng s? t? kh?i. Thng th??ng URI khng c?n ?i?u tr?, nh?ng ?i khi ti?p theo nhi?m vi rt l nhi?m khu?n ???ng h h?p trn. Tnh tr?ng ny g?i l nhi?m trng th? pht. Nhi?m trng xoang v tai gi?a l nh?ng lo?i nhi?m trng h h?p trn th? pht ph? bi?n nh?t. Vim ph?i do vi khu?n c?ng c th? lm cho URI tr?m tr?ng h?n. URI c th? lm cho b?nh hen suy?n v b?nh ph?i t?c ngh?n m?n tnh (COPD) tr?m tr?ng h?n. ?i khi, nh?ng bi?n ch?ng ny c th? c?n ph?i ???c ?i?u tr? c?p c?u n?i khoa v c th? ?e d?a tnh m?ng. NGUYN NHN H?u h?t t?t c? cc tr??ng h?p URI ??u do vi rt gy ra. Vi rt l m?t lo?i m?m b?nh v c th? ly lan t? ng??i ny sang ng??i khc. CC Y?U T? NGUY C? Qu v? c th? c nguy c? b? URI n?u:  Qu v? ht thu?c l.  Qu v? b? b?nh tim ho?c b?nh ph?i m?n tnh.  Qu v? c h? th?ng phng v? (mi?n d?ch) suy y?u.  Qu v? r?t tr? ho?c r?t gi.  Qu v? b? d? ?ng ? m?i ho?c b? hen suy?n.  Qu v? lm vi?c trong cc khu v?c ?ng ?c ho?c thng kh km.  Qu v? lm vi?c trong cc trung tm ch?m [place] s?c kh?e ho?c tr??ng h?c. D?U HI?U V TRI?U CH?NG Cc tri?u ch?ng th??ng pht tri?n sau khi qu v? ti?p xc v?i vi rt c?m l?nh 2-3 ngy. H?u h?t cc tr??ng h?p URI do vi rt ko di 7-10 ngy. Tuy nhin, cc tr??ng h?p URI do vi rt cm (vi rt cm) c th? ko di 14 - 18 ngy v th??ng n?ng h?n. Tri?u ch?ng c th? bao g?m:  Ch?y  n??c m?i ho?c ng?t (ngh?t) m?i.  H?t h?i.  Ho.  ?au h?ng.  ?au ??u.  M?t m?i.  S?t.  ?n khng ngon mi?ng.  ?au ? trn, pha sau m?t v trn x??ng g m (?au xoang).  ?au nh?c c?. CH?N ?ON Chuyn gia ch?m [place] s?c kh?e c th? ch?n ?on URI b?ng cch:  Khm th?c th?.  Ki?m tra ?? kh?ng ??nh cc tri?u ch?ng khng ph?i l do m?t tnh tr?ng khc, ch?ng h?n nh?:  Vim h?ng do lin c?u khu?n.  Vim xoang.  Vim ph?i.  Hen suy?n. ?I?U TR? URI t? kh?i sau m?t th?i gian. B?nh khng th? ch?a kh?i ???c b?ng thu?c, nh?ng thu?c c th? ???c k ??n v khuy?n ngh? dng ?? lm gi?m cc tri?u ch?ng. Thu?c c th? gip:  H? s?t.  Gi?m ho.  Gi?m ngh?t m?i. H??NG D?N CH?M [place] T?I NH  Ch? s? d?ng thu?c theo ch? d?n c?a chuyn gia ch?m [place] s?c kh?e.  Xc mi?ng b?ng n??c mu?i ?m ho?c dng thu?c gi?m ho ?? lm  d?u h?ng qu v? theo ch? d?n c?a chuyn gia ch?m [place] s?c kh?e.  S? d?ng my t?o ?? ?m b?ng s??ng m ?m ho?c ht h?i n??c t? m?t vi sen ?? t?ng ?? ?m khng kh. ?i?u ny c th? gip d? th? h?n.  U?ng ?? n??c ?? gi? cho n??c ti?u trong ho?c c mu vng nh?t.  ?n sp ho?c cc lo?i n??c canh trong khc v duy tr ch? ?? dinh d??ng t?t.  Ngh? ng?i khi c?n.  Tr? l?i lm vi?c khi nhi?t ?? c?a qu v? ? tr? l?i bnh th??ng ho?c theo chuyn gia ch?m [place] s?c kh?e h??ng d?n. Qu v? c th? c?n ? nh lu h?n ?? trnh ly nhi?m cho ng??i khc. Qu v? c?ng c th? s? d?ng m?t n? v r?a tay c?n th?n ?? ng?n ng?a ly lan vi rt.  T?ng c??ng s? d?ng ?ng thu?c ht n?u qu v? b? b?nh hen suy?n.  Khng s? d?ng b?t c? cc s?n ph?m thu?c l no, bao g?m thu?c l d?ng ht, thu?c l d?ng nhai ho?c thu?c l ?i?n t?. N?u qu v? c?n gip ?? ?? cai thu?c, hy h?i chuyn gia ch?m [place] s?c kh?e. PHNG NG?A Cch t?t nh?t ?? b?o v? b?n thn kh?i b? c?m l?nh l th?c hnh v? sinh t?t.  Hessie Diener ti?p xc b?ng mi?ng ho?c b?ng tay v?i nh?ng ng??i c tri?u ch?ng c?m l?nh.  R?a tay th??ng xuyn n?u c ti?p  xc. Khng c b?ng ch?ng r rng v? vi?c vitamin C, vitamin E, echinacea ho?c t?p th? d?c lm gi?m kh? n?ng b? c?m l?nh. Tuy nhin, qu v? lun c?n ngh? ng?i nhi?u, t?p th? d?c v c ch? ?? dinh d??ng t?t. ?I K[facility] N?U:  Qu v? b? tr?m tr?ng h?n ch? khng ?? h?n.  Tri?u ch?ng c?a qu v? khng ki?m sot ???c b?ng thu?c.  Qu v? b? ?n l?nh.  Qu v? b? kh th? h?n.  Qu v? c ??m mu nu ho?c ??Sander Nephew v? ti?t d?ch mu vng ho?c mu nu ? m?i.  Qu v? b? ?au ? m?t, ??c bi?t l khi qu v? ci v? pha tr??c.  Qu v? b? s?t.  Qu v? b? s?ng h?ch c?.  Qu v? th?y ?au khi nu?t.  Qu v? c nh?ng vng mu tr?ng ? thnh sau h?ng. NGAY L?P T?C ?I K[facility] N?U:  Qu v? b? n?ng v lin t?c:  ?au ??u.  ?au tai.  ?au xoang.  ?au ng?c.  Qu v? b? b?nh ph?i m?n tnh v b?t k? tnh tr?ng no sau ?y:  Th? kh kh.  Ho ko di.  Ho ra mu.  Thay ??i s? l??ng v mu s?c ??m thng th??ng c?a qu v?.  Qu v? b? c?ng c?.  Qu v? c nh?ng thay ??i v?:  Th? l?c.  Thnh l?c.  Suy ngh?.  Tm tnh. ??M B?O QU V?:  Hi?u r cc h??ng d?n ny.  S? theo di tnh tr?ng c?a mnh.  S? yu c?u tr? gip ngay l?p t?c n?u qu v? c?m th?y khng kh?e ho?c th?y tr?m tr?ng h?n. Thng tin ny khng nh?m m?c ?ch thay th? cho l?i khuyn m chuyn gia ch?m [place] s?c kh?e ni v?i qu v?. Hy b?o ??m qu v? ph?i th?o lu?n b?t k? v?n ?? g m qu v? c v?i chuyn gia ch?m [place] s?c kh?e c?a qu v?. Document Released: 05/22/2011 Document Revised: 03/23/2015 Document Reviewed: 02/11/2014 Elsevier Interactive  Patient Education  2017 Elsevier Inc.  ?nh tr?ng ng?c (Palpitations) ?nh tr?ng ng?c l c?m gic nh?p tim c?a qu v? khng ??u ho?c nhanh h?n bnh th??ng. Qu v? c th? c?m th?y nh? tim mnh rung ln ho?c b? nh?p. ?nh tr?ng ng?c th??ng khng ph?i l m?t v?n ?? nghim tr?ng. Chu?ng co? th? xu?t hi?n do nhi?u y?u t?, bao g?m hu?t thu?c, caffeine, r???u, c?ng th??ng va? m?t s? loa?i thu?c  nh?t ??nh. M?c du? h?u h?t ca?c nguyn nhn gy ?a?nh tr?ng ng??c l khng nghim tro?ng, nh?ng ?a?nh tr?ng ng??c co? th? la? m?t d?u hi?u cu?a m?t v?n ?? b?nh l nghim tro?ng. Trong m?t s? tr??ng h?p, qu v? c th? c?n c ?nh gi thm v? m?t y [place]. H??NG D?N CH?M [place] T?I NH Ch  ??n b?t c? thay ??i no v? tri?u ch?ng c?a qu v?. Ti?n hnh nh?ng hnh ??ng sau ?? tr? gip tnh tr?ng ny:  Tra?nh nh??ng th?? sau:  C ph, tr, n??c gi?i kht, thu?c gi?m cn v ?? u?ng giu n?ng l??ng c caffeine.  S c la.  R??u.  Khng s? d?ng b?t k? s?n ph?m thu?c l no, ch??ng ha?n thu?c l d?ng ht, thu?c l d?ng nhai v thu?c l ?i?n t?. N?u qu v? c?n gip ?? ?? cai thu?c, hy h?i chuyn gia ch?m [place] s?c kh?e.  C? g??ng gi?m c?ng th?ng v lo l?ng. Nh?ng th? c th? gip qu v? th? gin bao g?m:  T?p yoga.  Thi?n.  Ho?t ??ng th? ch?t nh? b?i l?i, ch?y b? ho?c ?i b?.  Pha?n h?i sinh ho?c. ?y la? m?t ph??ng pha?p giu?p quy? vi? ho?c ca?ch s?? du?ng tm tri? mi?nh ?? ki?m soa?t ca?c y?u t? trong c? th?, ch??ng ha?n nh? nhi?p tim.  Ngh? ng?i v ng? th?t nhi?u.  Ch? s? d?ng thu?c khng c?n k ??n v thu?c c?n k ??n theo ch? d?n c?a chuyn gia ch?m [place] s?c kh?e.  Tun th? t?t c? cc cu?c h?n khm l?i theo ch? d?n c?a chuyn gia ch?m [place] s?c kh?e. ?i?u ny c vai tr quan tr?ng. ?I KHM N?U:  Qu v? ti?p t?c c nh?p tim nhanh ho?c khng ??u sau 24 ti?ng.  Tri?u ch?ng ?nh tr?ng ng?c c?a qu v? x?y ra th??ng xuyn h?n. NGAY L?P T?C ?I KHM N?U:  Qu v? b? ?au ng?c ho?c kh th?.  Qu v? b? ?au ??u nhi?u.  Qu v? c?m th?y chng m?t ho?c ng?t x?u. Thng tin ny khng nh?m m?c ?ch thay th? cho l?i khuyn m chuyn gia ch?m [place] s?c kh?e ni v?i qu v?. Hy b?o ??m qu v? ph?i th?o lu?n b?t k? v?n ?? g m qu v? c v?i chuyn gia ch?m [place] s?c kh?e c?a qu v?. Document Released: 11/06/2005 Document Revised: 02/28/2016 Document Reviewed: 07/22/2015 Elsevier Interactive  Patient Education  2017 Elsevier Inc.     IF you received an x-ray today, you will receive an invoice from [facility]. Please contact [facility] at [phone] with questions or concerns regarding your invoice.   IF you received labwork today, you will receive an invoice from [facility]. Please contact [facility] at [phone] with questions or concerns regarding your invoice.   Our billing staff will not be able to assist you with questions regarding bills from these companies.  You will be contacted with the lab results as soon as they are available. The fastest way to get your results is to activate your My  Chart account. Instructions are located on the last page of this paperwork. If you have not heard from [place] regarding the results in 2 weeks, please contact this office.

## 2017-01-23 DIAGNOSIS — Z0271 Encounter for disability determination: Secondary | ICD-10-CM

## 2017-02-14 ENCOUNTER — Other Ambulatory Visit: Payer: Self-pay | Admitting: Family Medicine

## 2017-02-14 DIAGNOSIS — Z1231 Encounter for screening mammogram for malignant neoplasm of breast: Secondary | ICD-10-CM

## 2017-03-06 ENCOUNTER — Ambulatory Visit
Admission: RE | Admit: 2017-03-06 | Discharge: 2017-03-06 | Disposition: A | Discharge status: Home / Self Care | Payer: Medicaid Other | Source: Ambulatory Visit | Attending: Family Medicine | Admitting: Family Medicine

## 2017-03-06 DIAGNOSIS — Z1231 Encounter for screening mammogram for malignant neoplasm of breast: Secondary | ICD-10-CM

## 2017-10-23 ENCOUNTER — Encounter (HOSPITAL_COMMUNITY): Payer: Self-pay | Admitting: Emergency Medicine

## 2017-10-23 ENCOUNTER — Other Ambulatory Visit: Payer: Self-pay

## 2017-10-23 DIAGNOSIS — R51 Headache: Secondary | ICD-10-CM | POA: Insufficient documentation

## 2017-10-23 DIAGNOSIS — Z87442 Personal history of urinary calculi: Secondary | ICD-10-CM | POA: Insufficient documentation

## 2017-10-23 DIAGNOSIS — Z833 Family history of diabetes mellitus: Secondary | ICD-10-CM | POA: Insufficient documentation

## 2017-10-23 DIAGNOSIS — Z79899 Other long term (current) drug therapy: Secondary | ICD-10-CM | POA: Insufficient documentation

## 2017-10-23 DIAGNOSIS — G43A0 Cyclical vomiting, not intractable: Secondary | ICD-10-CM | POA: Diagnosis not present

## 2017-10-23 DIAGNOSIS — R112 Nausea with vomiting, unspecified: Secondary | ICD-10-CM | POA: Diagnosis present

## 2017-10-23 DIAGNOSIS — I2510 Atherosclerotic heart disease of native coronary artery without angina pectoris: Secondary | ICD-10-CM | POA: Diagnosis not present

## 2017-10-23 DIAGNOSIS — I10 Essential (primary) hypertension: Secondary | ICD-10-CM | POA: Diagnosis not present

## 2017-10-23 DIAGNOSIS — R7303 Prediabetes: Secondary | ICD-10-CM | POA: Diagnosis not present

## 2017-10-23 LAB — COMPREHENSIVE METABOLIC PANEL
ALK PHOS: 70 U/L (ref 38–126)
ALT: 20 U/L (ref 14–54)
AST: 25 U/L (ref 15–41)
Albumin: 4.4 g/dL (ref 3.5–5.0)
Anion gap: 11 (ref 5–15)
BILIRUBIN TOTAL: 0.9 mg/dL (ref 0.3–1.2)
BUN: 13 mg/dL (ref 6–20)
CO2: 25 mmol/L (ref 22–32)
Calcium: 8.9 mg/dL (ref 8.9–10.3)
Chloride: 96 mmol/L — ABNORMAL LOW (ref 101–111)
Creatinine, Ser: 0.49 mg/dL (ref 0.44–1.00)
GFR calc Af Amer: >60 mL/min (ref >60)
GFR calc non Af Amer: >60 mL/min (ref >60)
Glucose, Bld: 127 mg/dL — ABNORMAL HIGH (ref 65–99)
POTASSIUM: 3.5 mmol/L (ref 3.5–5.1)
Sodium: 132 mmol/L — ABNORMAL LOW (ref 135–145)
TOTAL PROTEIN: 8.1 g/dL (ref 6.5–8.1)

## 2017-10-23 LAB — CBC
HEMATOCRIT: 40.9 % (ref 36.0–46.0)
Hemoglobin: 13.4 g/dL (ref 12.0–15.0)
MCH: 26.2 pg (ref 26.0–34.0)
MCHC: 32.8 g/dL (ref 30.0–36.0)
MCV: 80 fL (ref 78.0–100.0)
Platelets: 253 10*3/uL (ref 150–400)
RBC: 5.11 MIL/uL (ref 3.87–5.11)
RDW: 14.1 % (ref 11.5–15.5)
WBC: 11.4 10*3/uL — ABNORMAL HIGH (ref 4.0–10.5)

## 2017-10-23 LAB — LIPASE, BLOOD: Lipase: 28 U/L (ref 11–51)

## 2017-10-23 MED ORDER — ONDANSETRON 4 MG PO TBDP
4.0000 mg | ORAL_TABLET | Freq: Once | ORAL | Status: AC | PRN
  Administered 2017-10-23: 4 mg via ORAL
  Filled 2017-10-23: qty 1

## 2017-10-23 NOTE — ED Triage Notes (Signed)
Pt states she has headache, vomiting, and body aches  Pt has had cold/flu like sxs all week  Was seen by her dr this morning and was given medication for her blood pressure and her stomach but has continued to vomit all day and the headache is worse  Pt had an EKG at her dr and was told it was normal

## 2017-10-24 ENCOUNTER — Emergency Department (HOSPITAL_COMMUNITY): Payer: Medicaid Other

## 2017-10-24 ENCOUNTER — Emergency Department (HOSPITAL_COMMUNITY)
Admission: EM | Admit: 2017-10-24 | Discharge: 2017-10-24 | Disposition: A | Discharge status: Home / Self Care | Payer: Medicaid Other | Attending: Emergency Medicine | Admitting: Emergency Medicine

## 2017-10-24 DIAGNOSIS — G4489 Other headache syndrome: Secondary | ICD-10-CM

## 2017-10-24 DIAGNOSIS — R1115 Cyclical vomiting syndrome unrelated to migraine: Secondary | ICD-10-CM

## 2017-10-24 HISTORY — DX: Essential (primary) hypertension: I10

## 2017-10-24 HISTORY — DX: Atherosclerotic heart disease of native coronary artery without angina pectoris: I25.10

## 2017-10-24 LAB — URINALYSIS, ROUTINE W REFLEX MICROSCOPIC
BACTERIA UA: NONE SEEN
Bilirubin Urine: NEGATIVE
Glucose, UA: NEGATIVE mg/dL
Ketones, ur: 20 mg/dL — AB
Leukocytes, UA: NEGATIVE
NITRITE: NEGATIVE
PH: 6 (ref 5.0–8.0)
Protein, ur: NEGATIVE mg/dL
Specific Gravity, Urine: 1.012 (ref 1.005–1.030)
Squamous Epithelial / LPF: NONE SEEN

## 2017-10-24 MED ORDER — DIPHENHYDRAMINE HCL 50 MG/ML IJ SOLN
25.0000 mg | Freq: Once | INTRAMUSCULAR | Status: AC
  Administered 2017-10-24: 25 mg via INTRAVENOUS
  Filled 2017-10-24: qty 1

## 2017-10-24 MED ORDER — METOCLOPRAMIDE HCL 5 MG/ML IJ SOLN
10.0000 mg | Freq: Once | INTRAMUSCULAR | Status: AC
Start: 2017-10-24 — End: 2017-10-24
  Administered 2017-10-24: 10 mg via INTRAVENOUS
  Filled 2017-10-24: qty 2

## 2017-10-24 NOTE — ED Provider Notes (Signed)
[facility]-EMERGENCY DEPT Provider Note   CSN: [id number] Arrival date & time: 10/23/17  2027     History   Chief Complaint Chief Complaint  Patient presents with  . Headache  . Emesis    HPI Ana Glenn is a 61 y.o. female.  The history is provided by the patient and a relative. A language interpreter was used ([id number]).  Headache   This is a new problem. The current episode started 2 days ago. The problem occurs constantly. The problem has been gradually worsening. The headache is associated with nothing. Pain location: Neck with radiation into top of head. The quality of the pain is described as dull. The pain is moderate. Associated symptoms include nausea and vomiting. Pertinent negatives include no fever and no syncope. She has tried nothing for the symptoms.  Emesis   Associated symptoms include headaches. Pertinent negatives include no fever.   Patient presents from home for headache for the past 1-2 days Gradual in onset, causing nausea and vomiting No falls or injuries reported No travel Denies previous history of stroke She reports she is concerned because her blood pressure is elevated with associated headache No acute vision deficits No new chest pain or shortness of breath She did have an episode of nausea vomiting yesterday Saw her PCP for indigestion yesterday SHe reports minimal cough, no fever Past Medical History:  Diagnosis Date  . CAD (coronary artery disease)   . Hypertension   . Kidney stones     Patient Active Problem List   Diagnosis Date Noted  . Language barrier to communication 02/09/2016  . Vitamin D deficiency 10/05/2015  . Prediabetes 10/05/2015  . H/O orthostatic hypotension 10/01/2015  . Visual disturbance 10/01/2015  . Routine health maintenance 10/01/2015  . Right shoulder pain 10/01/2015  . H/O pericarditis 02/02/2015  . Hyperlipidemia 02/02/2015  . History of kidney stones 12/09/2014    Past  Surgical History:  Procedure Laterality Date  . ABDOMINAL HYSTERECTOMY    . CORONARY ARTERY BYPASS GRAFT    . NEPHRECTOMY      OB History    No data available       Home Medications    Prior to Admission medications   Medication Sig Start Date End Date Taking? Authorizing Provider  acetaminophen (TYLENOL) 500 MG tablet Take 500 mg by mouth every 6 (six) hours as needed for mild pain.     [provider]  atorvastatin (LIPITOR) 80 MG tablet Take 1 tablet (80 mg total) by mouth daily. 10/10/16   Doristine BosworthStallings, Zoe A, MD  benzonatate (TESSALON) 100 MG capsule Take 1-2 capsules (100-200 mg total) by mouth 3 (three) times daily as needed for cough. 11/09/16   Trena PlattEnglish, Stephanie D, PA  Cholecalciferol (VITAMIN D) 2000 UNITS CAPS Take 1 capsule (2,000 Units total) by mouth daily. 10/08/15   Massie MaroonHollis, Lachina M, FNP  Guaifenesin [facility](MUCINEX MAXIMUM STRENGTH) 1200 MG TB12 Take 1 tablet (1,200 mg total) by mouth every 12 (twelve) hours as needed. 11/09/16   Trena PlattEnglish, Stephanie D, PA  meloxicam (MOBIC) 7.5 MG tablet Take 1 tablet (7.5 mg total) by mouth daily. 08/29/16   Morrell RiddleWeber, Sarah L, PA-C    Family History Family History  Problem Relation Age of Onset  . Diabetes Mother   . Cancer Brother     Social History Social History   Tobacco Use  . Smoking status: Never Smoker  . Smokeless tobacco: Never Used  Substance Use Topics  . Alcohol use: No  .  Drug use: No     Allergies   Patient has no known allergies.   Review of Systems Review of Systems  Constitutional: Negative for fever.  Cardiovascular: Negative for chest pain and syncope.  Gastrointestinal: Positive for nausea and vomiting.  Neurological: Positive for headaches. Negative for syncope and weakness.  All other systems reviewed and are negative.    Physical Exam Updated Vital Signs BP (!) 141/87 (BP Location: Left Arm)   Pulse 91   Temp (!) 97.4 F (36.3 C)   Resp 18   Ht 1.524 m (5')   Wt 53.5 kg (118 lb)    SpO2 96%   BMI 23.05 kg/m   Physical Exam CONSTITUTIONAL: Well developed/well nourished HEAD: Normocephalic/atraumatic EYES: EOMI/PERRL, no nystagmus, no ptosis ENMT: Mucous membranes moist NECK: supple no meningeal signs, no bruits CV: S1/S2 noted, no murmurs/rubs/gallops noted LUNGS: Lungs are clear to auscultation bilaterally, no apparent distress ABDOMEN: soft, nontender, no rebound or guarding GU:no cva tenderness NEURO:Awake/alert, face symmetric, no arm or leg drift is noted Equal 5/5 strength with shoulder abduction, elbow flex/extension, wrist flex/extension in upper extremities and equal hand grips bilaterally Equal 5/5 strength with hip flexion,knee flex/extension, foot dorsi/plantar flexion Cranial nerves 3/4/5/6/05/28/09/11/12 tested and intact Gait normal without ataxia Sensation to light touch intact in all extremities EXTREMITIES: pulses normal, full ROM SKIN: warm, color normal PSYCH: no abnormalities of mood noted   ED Treatments / Results  Labs (all labs ordered are listed, but only abnormal results are displayed) Labs Reviewed  COMPREHENSIVE METABOLIC PANEL - Abnormal; Notable for the following components:      Result Value   Sodium 132 (*)    Chloride 96 (*)    Glucose, Bld 127 (*)    All other components within normal limits  CBC - Abnormal; Notable for the following components:   WBC 11.4 (*)    All other components within normal limits  URINALYSIS, ROUTINE W REFLEX MICROSCOPIC - Abnormal; Notable for the following components:   Hgb urine dipstick MODERATE (*)    Ketones, ur 20 (*)    All other components within normal limits  LIPASE, BLOOD    EKG  EKG Interpretation None       Radiology Ct Head Wo Contrast  Result Date: 10/24/2017 CLINICAL DATA:  61 year old female with headache. EXAM: CT HEAD WITHOUT CONTRAST TECHNIQUE: Contiguous axial images were obtained from the base of the skull through the vertex without intravenous contrast.  COMPARISON:  None. FINDINGS: Brain: Mild age-related atrophy and volume loss. There is no acute intracranial hemorrhage. No mass effect or midline shift. No extra-axial fluid collection. Vascular: No hyperdense vessel or unexpected calcification. Skull: Normal. Negative for fracture or focal lesion. Sinuses/Orbits: No acute finding. Other: None IMPRESSION: 1. No acute intracranial pathology. 2. Mild age-related atrophy. Electronically Signed   By: Elgie Collard M.D.   On: 10/24/2017 02:23    Procedures Procedures (including critical care time)  Medications Ordered in ED Medications  ondansetron (ZOFRAN-ODT) disintegrating tablet 4 mg (4 mg Oral Given 10/23/17 2100)  metoCLOPramide (REGLAN) injection 10 mg (10 mg Intravenous Given 10/24/17 0142)  diphenhydrAMINE (BENADRYL) injection 25 mg (25 mg Intravenous Given 10/24/17 0141)     Initial Impression / Assessment and Plan / ED Course  I have reviewed the triage vital signs and the nursing notes.  Pertinent labs & imaging results that were available during my care of the patient were reviewed by me and considered in my medical decision making (see chart for details).  Patient here with headache and elevated blood pressure, but reports headache was gradual in onset She is afebrile, and no meningeal signs to suggest meningitis History is not consistent with subarachnoid hemorrhage There is no signs of acute stroke However, due to age, difficulty communicating with patient due to need to use interpreter will obtain CT head If CT head is negative and patient is improved, will discharge home 3:09 AM  Patient improved, no distress For patient and family of CT head results D/c home Final Clinical Impressions(s) / ED Diagnoses   Final diagnoses:  Non-intractable cyclical vomiting with nausea  Other headache syndrome    ED Discharge Orders    None       Zadie RhineWickline, Rocko Fesperman, MD 10/24/17 [phone]

## 2018-01-31 ENCOUNTER — Other Ambulatory Visit: Payer: Self-pay | Admitting: Family Medicine

## 2018-01-31 ENCOUNTER — Other Ambulatory Visit: Payer: Self-pay | Admitting: Internal Medicine

## 2018-01-31 DIAGNOSIS — Z1231 Encounter for screening mammogram for malignant neoplasm of breast: Secondary | ICD-10-CM

## 2018-03-11 ENCOUNTER — Ambulatory Visit
Admission: RE | Admit: 2018-03-11 | Discharge: 2018-03-11 | Disposition: A | Discharge status: Home / Self Care | Payer: Medicaid Other | Source: Ambulatory Visit | Attending: Internal Medicine | Admitting: Internal Medicine

## 2018-03-11 DIAGNOSIS — Z1231 Encounter for screening mammogram for malignant neoplasm of breast: Secondary | ICD-10-CM

## 2019-04-28 ENCOUNTER — Other Ambulatory Visit: Payer: Self-pay | Admitting: Internal Medicine

## 2019-04-28 DIAGNOSIS — Z1231 Encounter for screening mammogram for malignant neoplasm of breast: Secondary | ICD-10-CM

## 2019-06-12 ENCOUNTER — Ambulatory Visit
Admission: RE | Admit: 2019-06-12 | Discharge: 2019-06-12 | Disposition: A | Discharge status: Home / Self Care | Payer: PRIVATE HEALTH INSURANCE | Source: Ambulatory Visit | Attending: Internal Medicine | Admitting: Internal Medicine

## 2019-06-12 ENCOUNTER — Other Ambulatory Visit: Payer: Self-pay

## 2019-06-12 DIAGNOSIS — Z1231 Encounter for screening mammogram for malignant neoplasm of breast: Secondary | ICD-10-CM

## 2020-05-10 ENCOUNTER — Other Ambulatory Visit: Payer: Self-pay | Admitting: Neurology

## 2020-05-13 ENCOUNTER — Other Ambulatory Visit: Payer: Self-pay | Admitting: Family Medicine

## 2020-05-13 DIAGNOSIS — Z1231 Encounter for screening mammogram for malignant neoplasm of breast: Secondary | ICD-10-CM

## 2020-06-14 ENCOUNTER — Ambulatory Visit: Payer: PRIVATE HEALTH INSURANCE

## 2020-06-15 ENCOUNTER — Ambulatory Visit: Payer: PRIVATE HEALTH INSURANCE

## 2020-06-24 ENCOUNTER — Other Ambulatory Visit: Payer: Self-pay

## 2020-06-24 ENCOUNTER — Ambulatory Visit
Admission: RE | Admit: 2020-06-24 | Discharge: 2020-06-24 | Disposition: A | Discharge status: Home / Self Care | Payer: Medicare Other | Source: Ambulatory Visit | Attending: Family Medicine | Admitting: Family Medicine

## 2020-06-24 DIAGNOSIS — Z1231 Encounter for screening mammogram for malignant neoplasm of breast: Secondary | ICD-10-CM

## 2021-06-16 ENCOUNTER — Other Ambulatory Visit: Payer: Self-pay | Admitting: Internal Medicine

## 2021-06-16 DIAGNOSIS — Z1231 Encounter for screening mammogram for malignant neoplasm of breast: Secondary | ICD-10-CM

## 2021-08-05 ENCOUNTER — Ambulatory Visit
Admission: RE | Admit: 2021-08-05 | Discharge: 2021-08-05 | Disposition: A | Discharge status: Home / Self Care | Payer: Medicare Other | Source: Ambulatory Visit | Attending: Internal Medicine | Admitting: Internal Medicine

## 2021-08-05 ENCOUNTER — Other Ambulatory Visit: Payer: Self-pay

## 2021-08-05 DIAGNOSIS — Z1231 Encounter for screening mammogram for malignant neoplasm of breast: Secondary | ICD-10-CM

## 2021-09-28 ENCOUNTER — Encounter (HOSPITAL_BASED_OUTPATIENT_CLINIC_OR_DEPARTMENT_OTHER): Payer: Self-pay

## 2021-09-28 ENCOUNTER — Emergency Department (HOSPITAL_BASED_OUTPATIENT_CLINIC_OR_DEPARTMENT_OTHER): Payer: Medicare Other

## 2021-09-28 ENCOUNTER — Emergency Department (HOSPITAL_BASED_OUTPATIENT_CLINIC_OR_DEPARTMENT_OTHER)
Admission: EM | Admit: 2021-09-28 | Discharge: 2021-09-28 | Disposition: A | Discharge status: Home / Self Care | Payer: Medicare Other | Attending: Emergency Medicine | Admitting: Emergency Medicine

## 2021-09-28 ENCOUNTER — Other Ambulatory Visit: Payer: Self-pay

## 2021-09-28 DIAGNOSIS — R059 Cough, unspecified: Secondary | ICD-10-CM | POA: Insufficient documentation

## 2021-09-28 DIAGNOSIS — R531 Weakness: Secondary | ICD-10-CM | POA: Diagnosis not present

## 2021-09-28 DIAGNOSIS — Z20822 Contact with and (suspected) exposure to covid-19: Secondary | ICD-10-CM | POA: Insufficient documentation

## 2021-09-28 DIAGNOSIS — Z951 Presence of aortocoronary bypass graft: Secondary | ICD-10-CM | POA: Insufficient documentation

## 2021-09-28 DIAGNOSIS — I2510 Atherosclerotic heart disease of native coronary artery without angina pectoris: Secondary | ICD-10-CM | POA: Insufficient documentation

## 2021-09-28 DIAGNOSIS — I10 Essential (primary) hypertension: Secondary | ICD-10-CM | POA: Insufficient documentation

## 2021-09-28 DIAGNOSIS — Z79899 Other long term (current) drug therapy: Secondary | ICD-10-CM | POA: Diagnosis not present

## 2021-09-28 DIAGNOSIS — R509 Fever, unspecified: Secondary | ICD-10-CM | POA: Diagnosis not present

## 2021-09-28 DIAGNOSIS — U071 COVID-19: Secondary | ICD-10-CM

## 2021-09-28 LAB — URINALYSIS, MICROSCOPIC (REFLEX)

## 2021-09-28 LAB — URINALYSIS, ROUTINE W REFLEX MICROSCOPIC
Bilirubin Urine: NEGATIVE
Glucose, UA: NEGATIVE mg/dL
Ketones, ur: NEGATIVE mg/dL
Leukocytes,Ua: NEGATIVE
Nitrite: NEGATIVE
Protein, ur: NEGATIVE mg/dL
Specific Gravity, Urine: 1.015 (ref 1.005–1.030)
pH: 7 (ref 5.0–8.0)

## 2021-09-28 LAB — CBC WITH DIFFERENTIAL/PLATELET
Abs Immature Granulocytes: 0.01 10*3/uL (ref 0.00–0.07)
Basophils Absolute: 0 10*3/uL (ref 0.0–0.1)
Basophils Relative: 0 %
Eosinophils Absolute: 0.1 10*3/uL (ref 0.0–0.5)
Eosinophils Relative: 3 %
HCT: 42.2 % (ref 36.0–46.0)
Hemoglobin: 13.8 g/dL (ref 12.0–15.0)
Immature Granulocytes: 0 %
Lymphocytes Relative: 39 %
Lymphs Abs: 1.8 10*3/uL (ref 0.7–4.0)
MCH: 26.7 pg (ref 26.0–34.0)
MCHC: 32.7 g/dL (ref 30.0–36.0)
MCV: 81.8 fL (ref 80.0–100.0)
Monocytes Absolute: 0.3 10*3/uL (ref 0.1–1.0)
Monocytes Relative: 6 %
Neutro Abs: 2.4 10*3/uL (ref 1.7–7.7)
Neutrophils Relative %: 52 %
Platelets: 245 10*3/uL (ref 150–400)
RBC: 5.16 MIL/uL — ABNORMAL HIGH (ref 3.87–5.11)
RDW: 13.2 % (ref 11.5–15.5)
WBC: 5.1 10*3/uL (ref 4.0–10.5)
nRBC: 0 % (ref 0.0–0.2)

## 2021-09-28 LAB — COMPREHENSIVE METABOLIC PANEL
ALT: 18 U/L (ref 0–44)
AST: 21 U/L (ref 15–41)
Albumin: 3.6 g/dL (ref 3.5–5.0)
Alkaline Phosphatase: 42 U/L (ref 38–126)
Anion gap: 7 (ref 5–15)
BUN: 8 mg/dL (ref 8–23)
CO2: 26 mmol/L (ref 22–32)
Calcium: 8.7 mg/dL — ABNORMAL LOW (ref 8.9–10.3)
Chloride: 101 mmol/L (ref 98–111)
Creatinine, Ser: 0.44 mg/dL (ref 0.44–1.00)
GFR, Estimated: >60 mL/min (ref >60)
Glucose, Bld: 118 mg/dL — ABNORMAL HIGH (ref 70–99)
Potassium: 3.1 mmol/L — ABNORMAL LOW (ref 3.5–5.1)
Sodium: 134 mmol/L — ABNORMAL LOW (ref 135–145)
Total Bilirubin: 0.5 mg/dL (ref 0.3–1.2)
Total Protein: 7 g/dL (ref 6.5–8.1)

## 2021-09-28 LAB — RESP PANEL BY RT-PCR (FLU A&B, COVID) ARPGX2
Influenza A by PCR: NEGATIVE
Influenza B by PCR: NEGATIVE
SARS Coronavirus 2 by RT PCR: POSITIVE — AB

## 2021-09-28 LAB — TROPONIN I (HIGH SENSITIVITY): Troponin I (High Sensitivity): 3 ng/L (ref <18)

## 2021-09-28 LAB — LIPASE, BLOOD: Lipase: 39 U/L (ref 11–51)

## 2021-09-28 MED ORDER — POTASSIUM CHLORIDE CRYS ER 20 MEQ PO TBCR
40.0000 meq | EXTENDED_RELEASE_TABLET | Freq: Once | ORAL | Status: AC
  Administered 2021-09-28: 40 meq via ORAL
  Filled 2021-09-28: qty 2

## 2021-09-28 MED ORDER — SODIUM CHLORIDE 0.9 % IV BOLUS
1000.0000 mL | Freq: Once | INTRAVENOUS | Status: AC
  Administered 2021-09-28: 18:00:00 1000 mL via INTRAVENOUS

## 2021-09-28 NOTE — ED Provider Notes (Signed)
[facility] EMERGENCY DEPARTMENT Provider Note   CSN: [id number] Arrival date & time: 09/28/21  1700     History Chief Complaint  Patient presents with  . Weakness    Ana Glenn is a 65 y.o. female history of CAD, hypertension, kidney stones here presenting with fever and weakness and cough.  Patient just came back from [place] recently.  For the last week or so, patient has been feeling weak and fatigued.  Also has some intermittent chills and low-grade temperature.  Patient is fully vaccinated against COVID.  The history is provided by the patient.      Past Medical History:  Diagnosis Date  . CAD (coronary artery disease)   . Hypertension   . Kidney stones     Patient Active Problem List   Diagnosis Date Noted  . Language barrier to communication 02/09/2016  . Vitamin D deficiency 10/05/2015  . Prediabetes 10/05/2015  . H/O orthostatic hypotension 10/01/2015  . Visual disturbance 10/01/2015  . Routine health maintenance 10/01/2015  . Right shoulder pain 10/01/2015  . H/O pericarditis 02/02/2015  . Hyperlipidemia 02/02/2015  . History of kidney stones 12/09/2014    Past Surgical History:  Procedure Laterality Date  . ABDOMINAL HYSTERECTOMY    . CORONARY ARTERY BYPASS GRAFT    . NEPHRECTOMY       OB History   No obstetric history on file.     Family History  Problem Relation Age of Onset  . Diabetes Mother   . Cancer Brother     Social History   Tobacco Use  . Smoking status: Never  . Smokeless tobacco: Never  Vaping Use  . Vaping Use: Never used  Substance Use Topics  . Alcohol use: No  . Drug use: No    Home Medications Prior to Admission medications   Medication Sig Start Date End Date Taking? Authorizing Provider  acetaminophen (TYLENOL) 500 MG tablet Take 500 mg by mouth every 6 (six) hours as needed for mild pain.     [provider]  atorvastatin (LIPITOR) 80 MG tablet Take 1 tablet (80 mg total) by mouth daily.  10/10/16   Forrest Moron, MD  Cholecalciferol (VITAMIN D) 2000 UNITS CAPS Take 1 capsule (2,000 Units total) by mouth daily. 10/08/15   Dorena Dew, FNP  lisinopril (PRINIVIL,ZESTRIL) 5 MG tablet Take 5 mg by mouth daily.    [provider]    Allergies    Patient has no known allergies.  Review of Systems   Review of Systems  Constitutional:  Positive for chills.  Neurological:  Positive for weakness.  All other systems reviewed and are negative.  Physical Exam Updated Vital Signs BP (!) 161/83   Pulse 70   Temp 97.6 F (36.4 C) (Oral)   Resp 14   Ht 5' (1.524 m)   Wt 50.3 kg   SpO2 99%   BMI 21.66 kg/m   Physical Exam Vitals and nursing note reviewed.  Constitutional:      Appearance: Normal appearance.  HENT:     Head: Normocephalic.     Right Ear: Tympanic membrane normal.     Nose: Nose normal.     Mouth/Throat:     Mouth: Mucous membranes are dry.  Eyes:     Extraocular Movements: Extraocular movements intact.     Pupils: Pupils are equal, round, and reactive to light.  Cardiovascular:     Rate and Rhythm: Normal rate and regular rhythm.  Pulses: Normal pulses.     Heart sounds: Normal heart sounds.  Pulmonary:     Effort: Pulmonary effort is normal.     Breath sounds: Normal breath sounds.  Abdominal:     General: Abdomen is flat.     Palpations: Abdomen is soft.  Musculoskeletal:        General: Normal range of motion.     Cervical back: Normal range of motion and neck supple.  Skin:    General: Skin is warm.     Capillary Refill: Capillary refill takes less than 2 seconds.  Neurological:     General: No focal deficit present.     Mental Status: She is alert and oriented to person, place, and time.     Cranial Nerves: No cranial nerve deficit.     Sensory: No sensory deficit.     Motor: No weakness.  Psychiatric:        Mood and Affect: Mood normal.        Behavior: Behavior normal.    ED Results / Procedures / Treatments    Labs (all labs ordered are listed, but only abnormal results are displayed) Labs Reviewed  RESP PANEL BY RT-PCR (FLU A&B, COVID) ARPGX2 - Abnormal; Notable for the following components:      Result Value   SARS Coronavirus 2 by RT PCR POSITIVE (*)    All other components within normal limits  CBC WITH DIFFERENTIAL/PLATELET - Abnormal; Notable for the following components:   RBC 5.16 (*)    All other components within normal limits  COMPREHENSIVE METABOLIC PANEL - Abnormal; Notable for the following components:   Sodium 134 (*)    Potassium 3.1 (*)    Glucose, Bld 118 (*)    Calcium 8.7 (*)    All other components within normal limits  URINALYSIS, ROUTINE W REFLEX MICROSCOPIC - Abnormal; Notable for the following components:   Hgb urine dipstick MODERATE (*)    All other components within normal limits  URINALYSIS, MICROSCOPIC (REFLEX) - Abnormal; Notable for the following components:   Bacteria, UA FEW (*)    All other components within normal limits  LIPASE, BLOOD  TROPONIN I (HIGH SENSITIVITY)    EKG None  Radiology CT Head Wo Contrast  Result Date: 09/28/2021 CLINICAL DATA:  Dizziness, nonspecific.  Weakness, fever and cough. EXAM: CT HEAD WITHOUT CONTRAST TECHNIQUE: Contiguous axial images were obtained from the base of the skull through the vertex without intravenous contrast. COMPARISON:  10/24/2017 FINDINGS: Brain: The brain shows a normal appearance without evidence of malformation, atrophy, old or acute small or large vessel infarction, mass lesion, hemorrhage, hydrocephalus or extra-axial collection. Vascular: No hyperdense vessel. No evidence of atherosclerotic calcification. Skull: Normal.  No traumatic finding.  No focal bone lesion. Sinuses/Orbits: Sinuses are clear. Orbits appear normal. Mastoids are clear. Other: None significant IMPRESSION: Normal head CT. Electronically Signed   By: Nelson Chimes M.D.   On: 09/28/2021 18:39   DG Chest Port 1 View  Result Date:  09/28/2021 CLINICAL DATA:  Chills, weakness, fever, cough and fatigue for 1 week EXAM: PORTABLE CHEST 1 VIEW COMPARISON:  Portable 12/07/2014 FINDINGS: Normal heart size post median sternotomy. Mediastinal contours and pulmonary vascularity normal. Lungs clear. No pulmonary infiltrate, pleural effusion, or pneumothorax. Bones appear demineralized. IMPRESSION: EXAM 1710 HOURS COMPARED TO: IMPRESSION: EXAM 1710 HOURS COMPARED TO No acute abnormalities. Electronically Signed   By: Lavonia Dana M.D.   On: 09/28/2021 17:38    Procedures Procedures   Medications  Ordered in ED Medications  sodium chloride 0.9 % bolus 1,000 mL (0 mLs Intravenous Stopped 09/28/21 1856)  potassium chloride SA (KLOR-CON) CR tablet 40 mEq (40 mEq Oral Given 09/28/21 1852)    ED Course  I have reviewed the triage vital signs and the nursing notes.  Pertinent labs & imaging results that were available during my care of the patient were reviewed by me and considered in my medical decision making (see chart for details).    MDM Rules/Calculators/A&P                           Ana Glenn is a 65 y.o. female here presenting with chills and weakness.  She has nonfocal neuro exam.  She just traveled here from [place].  Concern for possible COVID versus flu versus pneumonia versus UTI.  Plan to get CBC and CMP and chest x-ray and CT head and urinalysis and COVID and flu test  9:05 PM Labs showed potassium was 3.1 that was supplemented.  UA is normal.  COVID test is positive.  She is day 6 of symptoms already.  She is outside the window for Paxlov[place] and has no oxygen requirement.  I told her that she will need to isolate at home and gave her strict return precaution  Final Clinical Impression(s) / ED Diagnoses Final diagnoses:  None    Rx / DC Orders ED Discharge Orders     None        Charlynne Pander, MD 09/28/21 2106

## 2021-09-28 NOTE — ED Notes (Signed)
ED Provider at bedside. 

## 2021-09-28 NOTE — ED Triage Notes (Signed)
Per daughter pt c/o weakness, fever, cough, fatigue x 1 week-sx started after flying to [place]-slow gait

## 2021-09-28 NOTE — ED Notes (Signed)
Patient transported to CT 

## 2021-09-28 NOTE — Discharge Instructions (Signed)
You have COVID causing your symptoms.  Please take Tylenol Motrin for fever  Please continue to isolate at home until this coming Monday  Return to ER if you have worse dizziness, weakness, trouble breathing, shortness of breath, fever

## 2021-09-28 NOTE — ED Notes (Signed)
Pt returned from CT. Pt ambulatory to restroom with standby assist. No issues observed.

## 2022-07-21 ENCOUNTER — Other Ambulatory Visit: Payer: Self-pay | Admitting: Family Medicine

## 2022-07-21 DIAGNOSIS — Z1231 Encounter for screening mammogram for malignant neoplasm of breast: Secondary | ICD-10-CM

## 2022-08-17 ENCOUNTER — Ambulatory Visit
Admission: RE | Admit: 2022-08-17 | Discharge: 2022-08-17 | Disposition: A | Discharge status: Home / Self Care | Payer: Medicare Other | Source: Ambulatory Visit | Attending: Family Medicine | Admitting: Family Medicine

## 2022-08-17 DIAGNOSIS — Z1231 Encounter for screening mammogram for malignant neoplasm of breast: Secondary | ICD-10-CM

## 2023-02-22 ENCOUNTER — Other Ambulatory Visit (HOSPITAL_BASED_OUTPATIENT_CLINIC_OR_DEPARTMENT_OTHER): Payer: Self-pay

## 2023-02-22 ENCOUNTER — Emergency Department (HOSPITAL_BASED_OUTPATIENT_CLINIC_OR_DEPARTMENT_OTHER)
Admission: EM | Admit: 2023-02-22 | Discharge: 2023-02-22 | Disposition: A | Discharge status: Home / Self Care | Payer: Medicare Other | Attending: Emergency Medicine | Admitting: Emergency Medicine

## 2023-02-22 ENCOUNTER — Encounter (HOSPITAL_BASED_OUTPATIENT_CLINIC_OR_DEPARTMENT_OTHER): Payer: Self-pay | Admitting: Pediatrics

## 2023-02-22 ENCOUNTER — Emergency Department (HOSPITAL_BASED_OUTPATIENT_CLINIC_OR_DEPARTMENT_OTHER): Payer: Medicare Other

## 2023-02-22 ENCOUNTER — Other Ambulatory Visit: Payer: Self-pay

## 2023-02-22 DIAGNOSIS — J020 Streptococcal pharyngitis: Secondary | ICD-10-CM | POA: Insufficient documentation

## 2023-02-22 DIAGNOSIS — Z1152 Encounter for screening for COVID-19: Secondary | ICD-10-CM | POA: Diagnosis not present

## 2023-02-22 DIAGNOSIS — Z79899 Other long term (current) drug therapy: Secondary | ICD-10-CM | POA: Insufficient documentation

## 2023-02-22 DIAGNOSIS — I2510 Atherosclerotic heart disease of native coronary artery without angina pectoris: Secondary | ICD-10-CM | POA: Diagnosis not present

## 2023-02-22 DIAGNOSIS — R531 Weakness: Secondary | ICD-10-CM | POA: Insufficient documentation

## 2023-02-22 DIAGNOSIS — R509 Fever, unspecified: Secondary | ICD-10-CM | POA: Diagnosis present

## 2023-02-22 DIAGNOSIS — I10 Essential (primary) hypertension: Secondary | ICD-10-CM | POA: Diagnosis not present

## 2023-02-22 LAB — URINALYSIS, ROUTINE W REFLEX MICROSCOPIC
Bilirubin Urine: NEGATIVE
Glucose, UA: NEGATIVE mg/dL
Ketones, ur: NEGATIVE mg/dL
Leukocytes,Ua: NEGATIVE
Nitrite: NEGATIVE
Protein, ur: NEGATIVE mg/dL
Specific Gravity, Urine: 1.01 (ref 1.005–1.030)
pH: 6.5 (ref 5.0–8.0)

## 2023-02-22 LAB — COMPREHENSIVE METABOLIC PANEL
ALT: 18 U/L (ref 0–44)
AST: 23 U/L (ref 15–41)
Albumin: 3.9 g/dL (ref 3.5–5.0)
Alkaline Phosphatase: 63 U/L (ref 38–126)
Anion gap: 11 (ref 5–15)
BUN: 12 mg/dL (ref 8–23)
CO2: 21 mmol/L — ABNORMAL LOW (ref 22–32)
Calcium: 8.9 mg/dL (ref 8.9–10.3)
Chloride: 98 mmol/L (ref 98–111)
Creatinine, Ser: 0.54 mg/dL (ref 0.44–1.00)
GFR, Estimated: >60 mL/min (ref >60)
Glucose, Bld: 130 mg/dL — ABNORMAL HIGH (ref 70–99)
Potassium: 3.4 mmol/L — ABNORMAL LOW (ref 3.5–5.1)
Sodium: 130 mmol/L — ABNORMAL LOW (ref 135–145)
Total Bilirubin: 0.5 mg/dL (ref 0.3–1.2)
Total Protein: 7.8 g/dL (ref 6.5–8.1)

## 2023-02-22 LAB — CBC WITH DIFFERENTIAL/PLATELET
Abs Immature Granulocytes: 0.07 10*3/uL (ref 0.00–0.07)
Basophils Absolute: 0.1 10*3/uL (ref 0.0–0.1)
Basophils Relative: 0 %
Eosinophils Absolute: 0.4 10*3/uL (ref 0.0–0.5)
Eosinophils Relative: 3 %
HCT: 39.9 % (ref 36.0–46.0)
Hemoglobin: 13 g/dL (ref 12.0–15.0)
Immature Granulocytes: 1 %
Lymphocytes Relative: 7 %
Lymphs Abs: 1.1 10*3/uL (ref 0.7–4.0)
MCH: 26.4 pg (ref 26.0–34.0)
MCHC: 32.6 g/dL (ref 30.0–36.0)
MCV: 80.9 fL (ref 80.0–100.0)
Monocytes Absolute: 0.6 10*3/uL (ref 0.1–1.0)
Monocytes Relative: 4 %
Neutro Abs: 12.7 10*3/uL — ABNORMAL HIGH (ref 1.7–7.7)
Neutrophils Relative %: 85 %
Platelets: 206 10*3/uL (ref 150–400)
RBC: 4.93 MIL/uL (ref 3.87–5.11)
RDW: 13.6 % (ref 11.5–15.5)
WBC: 14.9 10*3/uL — ABNORMAL HIGH (ref 4.0–10.5)
nRBC: 0 % (ref 0.0–0.2)

## 2023-02-22 LAB — URINALYSIS, MICROSCOPIC (REFLEX)

## 2023-02-22 LAB — RESP PANEL BY RT-PCR (RSV, FLU A&B, COVID)  RVPGX2
Influenza A by PCR: NEGATIVE
Influenza B by PCR: NEGATIVE
Resp Syncytial Virus by PCR: NEGATIVE
SARS Coronavirus 2 by RT PCR: NEGATIVE

## 2023-02-22 LAB — GROUP A STREP BY PCR: Group A Strep by PCR: DETECTED — AB

## 2023-02-22 LAB — LACTIC ACID, PLASMA: Lactic Acid, Venous: 1.1 mmol/L (ref 0.5–1.9)

## 2023-02-22 LAB — PROTIME-INR
INR: 1 (ref 0.8–1.2)
Prothrombin Time: 13.4 seconds (ref 11.4–15.2)

## 2023-02-22 MED ORDER — ACETAMINOPHEN 325 MG PO TABS
650.0000 mg | ORAL_TABLET | Freq: Once | ORAL | Status: AC
  Administered 2023-02-22: 650 mg via ORAL
  Filled 2023-02-22: qty 2

## 2023-02-22 MED ORDER — SODIUM CHLORIDE 0.9 % IV BOLUS
1000.0000 mL | Freq: Once | INTRAVENOUS | Status: AC
  Administered 2023-02-22: 1000 mL via INTRAVENOUS

## 2023-02-22 MED ORDER — PENICILLIN V POTASSIUM 250 MG PO TABS
500.0000 mg | ORAL_TABLET | Freq: Once | ORAL | Status: AC
  Administered 2023-02-22: 500 mg via ORAL
  Filled 2023-02-22: qty 2

## 2023-02-22 MED ORDER — PENICILLIN V POTASSIUM 500 MG PO TABS
500.0000 mg | ORAL_TABLET | Freq: Three times a day (TID) | ORAL | 0 refills | Status: AC
  Filled 2023-02-22: qty 30, 10d supply, fill #0

## 2023-02-22 NOTE — ED Provider Notes (Signed)
[facility] [place] Provider Note   CSN: [id number] Arrival date & time: 02/22/23  1332     History  Chief Complaint  Patient presents with   URI    Ana Glenn is a 67 y.o. female.  Patient presents to the emergency department complaining of 1 week of generalized weakness.  Over the past 2 days the patient has also developed sore throat, body aches, chills, and fever of 101 F at home.  The patient went to urgent care earlier today for evaluation who recommended she come to the emergency department.  She states that she had Tylenol at approximately 11 this morning.  The patient denies abdominal pain, nausea, vomiting, chest pain, shortness of breath, urinary symptoms.  Past medical history significant for hypertension, coronary artery disease, kidney stones  HPI     Home Medications Prior to Admission medications   Medication Sig Start Date End Date Taking? Authorizing Provider  penicillin v potassium (VEETID) 500 MG tablet Take 1 tablet (500 mg total) by mouth 3 (three) times daily. 02/22/23  Yes Dorothyann Peng, PA-C  acetaminophen (TYLENOL) 500 MG tablet Take 500 mg by mouth every 6 (six) hours as needed for mild pain.     [provider]  atorvastatin (LIPITOR) 80 MG tablet Take 1 tablet (80 mg total) by mouth daily. 10/10/16   Forrest Moron, MD  Cholecalciferol (VITAMIN D) 2000 UNITS CAPS Take 1 capsule (2,000 Units total) by mouth daily. 10/08/15   Dorena Dew, FNP  lisinopril (PRINIVIL,ZESTRIL) 5 MG tablet Take 5 mg by mouth daily.    [provider]      Allergies    Patient has no known allergies.    Review of Systems   Review of Systems  Physical Exam Updated Vital Signs BP 128/88   Pulse 100   Temp 98.2 F (36.8 C) (Oral)   Resp 16   SpO2 97%  Physical Exam Vitals and nursing note reviewed.  Constitutional:      General: She is not in acute distress.    Appearance: She is well-developed.  HENT:      Head: Normocephalic and atraumatic.     Ears:     Comments: Bilateral TMs erythematous, no effusion or bulging appreciated    Mouth/Throat:     Mouth: Mucous membranes are moist.     Pharynx: Oropharyngeal exudate present.  Eyes:     Conjunctiva/sclera: Conjunctivae normal.  Cardiovascular:     Rate and Rhythm: Normal rate and regular rhythm.     Heart sounds: No murmur heard. Pulmonary:     Effort: Pulmonary effort is normal. No respiratory distress.     Breath sounds: Normal breath sounds.  Abdominal:     Palpations: Abdomen is soft.     Tenderness: There is no abdominal tenderness.  Musculoskeletal:        General: No swelling.     Cervical back: Neck supple.  Skin:    General: Skin is warm and dry.     Capillary Refill: Capillary refill takes less than 2 seconds.  Neurological:     Mental Status: She is alert.  Psychiatric:        Mood and Affect: Mood normal.     ED Results / Procedures / Treatments   Labs (all labs ordered are listed, but only abnormal results are displayed) Labs Reviewed  GROUP A STREP BY PCR - Abnormal; Notable for the following components:  Result Value   Group A Strep by PCR DETECTED (*)    All other components within normal limits  COMPREHENSIVE METABOLIC PANEL - Abnormal; Notable for the following components:   Sodium 130 (*)    Potassium 3.4 (*)    CO2 21 (*)    Glucose, Bld 130 (*)    All other components within normal limits  CBC WITH DIFFERENTIAL/PLATELET - Abnormal; Notable for the following components:   WBC 14.9 (*)    Neutro Abs 12.7 (*)    All other components within normal limits  URINALYSIS, ROUTINE W REFLEX MICROSCOPIC - Abnormal; Notable for the following components:   Hgb urine dipstick MODERATE (*)    All other components within normal limits  URINALYSIS, MICROSCOPIC (REFLEX) - Abnormal; Notable for the following components:   Bacteria, UA RARE (*)    All other components within normal limits  RESP PANEL BY  RT-PCR (RSV, FLU A&B, COVID)  RVPGX2  CULTURE, BLOOD (ROUTINE X 2)  CULTURE, BLOOD (ROUTINE X 2)  LACTIC ACID, PLASMA  PROTIME-INR  LACTIC ACID, PLASMA    EKG None  Radiology DG Chest 2 View  Result Date: 02/22/2023 CLINICAL DATA:  Sepsis EXAM: CHEST - 2 VIEW COMPARISON:  09/28/2021 FINDINGS: The heart size and mediastinal contours are within normal limits. Prior median sternotomy. Both lungs are clear. The visualized skeletal structures are unremarkable. IMPRESSION: No active cardiopulmonary disease. Electronically Signed   By: Davina Poke D.O.   On: 02/22/2023 14:49    Procedures Procedures    Medications Ordered in ED Medications  sodium chloride 0.9 % bolus 1,000 mL ( Intravenous Stopped 02/22/23 1638)  acetaminophen (TYLENOL) tablet 650 mg (650 mg Oral Given 02/22/23 1549)  penicillin v potassium (VEETID) tablet 500 mg (500 mg Oral Given 02/22/23 1729)    ED Course/ Medical Decision Making/ A&P                             Medical Decision Making Amount and/or Complexity of Data Reviewed Labs: ordered. Radiology: ordered.  Risk OTC drugs.   This patient presents to the ED for concern of bodyaches, sore throat, chills, fever, this involves an extensive number of treatment options, and is a complaint that carries with it a high risk of complications and morbidity.  The differential diagnosis includes strep throat, pneumonia, viral infection, sepsis, others   Co morbidities that complicate the patient evaluation  Coronary artery disease, hypertension   Additional history obtained:  Additional history obtained from family at bedside   Lab Tests:  I Ordered, and personally interpreted labs.  The pertinent results include: WBC 14.9, positive group A strep test, negative respiratory panel, lactic acid 1.1, UA with moderate hemoglobin and rare bacteria CMP with sodium 130, potassium 3.4   Imaging Studies ordered:  I ordered imaging studies including chest  x-ray I independently visualized and interpreted imaging which showed no active disease I agree with the radiologist interpretation   Problem List / ED Course / Critical interventions / Medication management   I ordered medication including saline bolus for fluid resuscitation, Tylenol for fever pain, penicillin for group a strep  Reevaluation of the patient after these medicines showed that the patient improved I have reviewed the patients home medicines and have made adjustments as needed    Test / Admission - Considered:  Patient's tachycardia improved with fluid administration.  She does have a group A strep infection.  No pneumonia on chest  x-ray.  No signs of urinary tract infection.  No abdominal tenderness to suggest intra-abdominal etiology.  I discussed admission versus discharge with patient and family and they are comfortable with discharge at this time with return precautions.  Plan to discharge home at this time with prescription for penicillin.  If patient's condition worsens plan for return to emergency department for further evaluation and management as needed.        Final Clinical Impression(s) / ED Diagnoses Final diagnoses:  Strep pharyngitis  Weakness    Rx / DC Orders ED Discharge Orders          Ordered    penicillin v potassium (VEETID) 500 MG tablet  3 times daily        02/22/23 1731              Ronny Bacon 02/22/23 1733    Tegeler, Gwenyth Allegra, MD 02/22/23 [phone]

## 2023-02-22 NOTE — ED Triage Notes (Signed)
Pt has had sore thorat, fatigue, fever body aches, lack of appetite and poor po intake for one week.  Pt was seen at [facility] and sent here for further evaluation.  Pt appears to be feeling poorly.  Translation by son in law in triage.

## 2023-02-22 NOTE — Discharge Instructions (Signed)
You were diagnosed today with strep throat.  Please take the prescribed antibiotic until it is complete.  Please be sure to hydrate as you are able.  If symptoms worsen or you develop any life-threatening symptoms please return to the emergency department.  Otherwise, please follow-up as needed with your primary care provider.

## 2023-02-27 LAB — CULTURE, BLOOD (ROUTINE X 2)
Culture: NO GROWTH
Culture: NO GROWTH
Special Requests: ADEQUATE
Special Requests: ADEQUATE

## 2023-06-28 ENCOUNTER — Ambulatory Visit (HOSPITAL_BASED_OUTPATIENT_CLINIC_OR_DEPARTMENT_OTHER)
Admission: RE | Admit: 2023-06-28 | Discharge: 2023-06-28 | Disposition: A | Discharge status: Home / Self Care | Payer: Medicare Other | Source: Ambulatory Visit | Attending: Urology | Admitting: Urology

## 2023-06-28 ENCOUNTER — Ambulatory Visit (INDEPENDENT_AMBULATORY_CARE_PROVIDER_SITE_OTHER): Payer: Medicare Other | Admitting: Urology

## 2023-06-28 ENCOUNTER — Encounter: Payer: Self-pay | Admitting: Urology

## 2023-06-28 VITALS — BP 174/83 | HR 111 | Ht 59.0 in | Wt 117.0 lb

## 2023-06-28 DIAGNOSIS — Z87442 Personal history of urinary calculi: Secondary | ICD-10-CM | POA: Diagnosis present

## 2023-06-28 DIAGNOSIS — R3129 Other microscopic hematuria: Secondary | ICD-10-CM

## 2023-06-28 NOTE — Progress Notes (Signed)
   Assessment: 1. History of kidney stones   2. Microscopic hematuria      Plan: KUB today to assess for any recurrent stones. Continue yearly follow-up or sooner if problems arise  Chief Complaint: Kidney stone  History of Present Illness:  Ana Glenn is a 67 y.o. female who is seen in consultation from Verlon Au, MD for evaluation of nephrolithiasis and microhematuria. Patient known to me from prior evaluation for stones as well as hematuria.. Patient was last seen by me June 2021.  She had negative GU evaluation 03/2020 Heme profile 03/2020--neg cytology. Isomorphic RBC's CTU--no stones, hydro or mass Cysto--normal  She is status post prior PCNL by Dr. Daine Floras for a partial staghorn calculus in 2017.   She continues to do very well clinically.  Her only complaint is some right sided hip pain that goes away in the mornings after she does her yoga consistent with musculoskeletal etiology.     Past Medical History:   Hyperlipidemia, hypertension   Past Surgical History:  Past Surgical History:  Procedure Laterality Date   ABDOMINAL HYSTERECTOMY     CORONARY ARTERY BYPASS GRAFT     NEPHRECTOMY      Allergies:  No Known Allergies  Family History:  Family History  Problem Relation Age of Onset   Diabetes Mother    Cancer Brother     Social History:  Social History   Tobacco Use   Smoking status: Never   Smokeless tobacco: Never  Vaping Use   Vaping status: Never Used  Substance Use Topics   Alcohol use: No   Drug use: No    Review of symptoms:  Constitutional:  Negative for unexplained weight loss, night sweats, fever, chills ENT:  Negative for nose bleeds, sinus pain, painful swallowing CV:  Negative for chest pain, shortness of breath, exercise intolerance, palpitations, loss of consciousness Resp:  Negative for cough, wheezing, shortness of breath GI:  Negative for nausea, vomiting, diarrhea, bloody stools GU:  Positives noted in HPI;  otherwise negative for gross hematuria, dysuria, urinary incontinence Neuro:  Negative for seizures, poor balance, limb weakness, slurred speech Psych:  Negative for lack of energy, depression, anxiety Endocrine:  Negative for polydipsia, polyuria, symptoms of hypoglycemia (dizziness, hunger, sweating) Hematologic:  Negative for anemia, purpura, petechia, prolonged or excessive bleeding, use of anticoagulants  Allergic:  Negative for difficulty breathing or choking as a result of exposure to anything; no shellfish allergy; no allergic response (rash/itch) to materials, foods  Physical exam: BP (!) 174/83   Pulse (!) 111   Ht 4\' 11"  (1.499 m)   Wt 117 lb (53.1 kg)   BMI 23.63 kg/m  GENERAL APPEARANCE:  Well appearing, well developed, well nourished, NAD   Results: Urinalysis is negative except for 3-10 RBC/hpf

## 2024-07-28 ENCOUNTER — Other Ambulatory Visit (HOSPITAL_BASED_OUTPATIENT_CLINIC_OR_DEPARTMENT_OTHER): Payer: Self-pay | Admitting: Physician Assistant

## 2024-07-28 DIAGNOSIS — D320 Benign neoplasm of cerebral meninges: Secondary | ICD-10-CM

## 2024-07-28 DIAGNOSIS — Z780 Asymptomatic menopausal state: Secondary | ICD-10-CM

## 2024-07-28 DIAGNOSIS — Z1231 Encounter for screening mammogram for malignant neoplasm of breast: Secondary | ICD-10-CM

## 2024-09-23 ENCOUNTER — Ambulatory Visit (HOSPITAL_BASED_OUTPATIENT_CLINIC_OR_DEPARTMENT_OTHER)
Admission: RE | Admit: 2024-09-23 | Discharge: 2024-09-23 | Disposition: A | Discharge status: Home / Self Care | Source: Ambulatory Visit | Attending: Physician Assistant | Admitting: Physician Assistant

## 2024-09-23 ENCOUNTER — Encounter (HOSPITAL_BASED_OUTPATIENT_CLINIC_OR_DEPARTMENT_OTHER): Payer: Self-pay

## 2024-09-23 DIAGNOSIS — Z1231 Encounter for screening mammogram for malignant neoplasm of breast: Secondary | ICD-10-CM | POA: Insufficient documentation

## 2024-09-23 DIAGNOSIS — D320 Benign neoplasm of cerebral meninges: Secondary | ICD-10-CM | POA: Insufficient documentation

## 2024-09-23 DIAGNOSIS — Z780 Asymptomatic menopausal state: Secondary | ICD-10-CM | POA: Insufficient documentation

## 2024-09-23 MED ORDER — GADOBUTROL 1 MMOL/ML IV SOLN
5.0000 mL | Freq: Once | INTRAVENOUS | Status: AC | PRN
  Administered 2024-09-23: 5 mL via INTRAVENOUS
